# Patient Record
Sex: Female | Born: 1995 | Race: White | Hispanic: Yes | Marital: Single | Smoking: Never smoker
Health system: Southern US, Community
[De-identification: ages and names within clinical notes are randomized; demographics above are authoritative.]

## PROBLEM LIST (undated history)

## (undated) ENCOUNTER — Inpatient Hospital Stay (HOSPITAL_COMMUNITY): Payer: Self-pay

## (undated) DIAGNOSIS — J45909 Unspecified asthma, uncomplicated: Secondary | ICD-10-CM

---
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
## 2017-08-19 ENCOUNTER — Emergency Department (HOSPITAL_COMMUNITY): Payer: Self-pay

## 2017-08-19 ENCOUNTER — Emergency Department (HOSPITAL_COMMUNITY)
Admission: EM | Admit: 2017-08-19 | Discharge: 2017-08-20 | Disposition: A | Discharge status: Home / Self Care | Payer: Self-pay | Attending: Emergency Medicine | Admitting: Emergency Medicine

## 2017-08-19 ENCOUNTER — Other Ambulatory Visit: Payer: Self-pay

## 2017-08-19 DIAGNOSIS — J45909 Unspecified asthma, uncomplicated: Secondary | ICD-10-CM | POA: Insufficient documentation

## 2017-08-19 DIAGNOSIS — Z79899 Other long term (current) drug therapy: Secondary | ICD-10-CM | POA: Insufficient documentation

## 2017-08-19 DIAGNOSIS — F410 Panic disorder [episodic paroxysmal anxiety] without agoraphobia: Secondary | ICD-10-CM | POA: Insufficient documentation

## 2017-08-19 LAB — CBC
HEMATOCRIT: 42.3 % (ref 36.0–46.0)
Hemoglobin: 13.9 g/dL (ref 12.0–15.0)
MCH: 28.1 pg (ref 26.0–34.0)
MCHC: 32.9 g/dL (ref 30.0–36.0)
MCV: 85.6 fL (ref 78.0–100.0)
PLATELETS: 309 10*3/uL (ref 150–400)
RBC: 4.94 MIL/uL (ref 3.87–5.11)
RDW: 12.7 % (ref 11.5–15.5)
WBC: 9.1 10*3/uL (ref 4.0–10.5)

## 2017-08-19 LAB — BASIC METABOLIC PANEL
Anion gap: 9 (ref 5–15)
BUN: 8 mg/dL (ref 6–20)
CO2: 27 mmol/L (ref 22–32)
CREATININE: 0.83 mg/dL (ref 0.44–1.00)
Calcium: 9 mg/dL (ref 8.9–10.3)
Chloride: 105 mmol/L (ref 101–111)
GFR calc Af Amer: >60 mL/min (ref >60)
Glucose, Bld: 89 mg/dL (ref 65–99)
POTASSIUM: 3.5 mmol/L (ref 3.5–5.1)
Sodium: 141 mmol/L (ref 135–145)

## 2017-08-19 LAB — I-STAT TROPONIN, ED: TROPONIN I, POC: 0 ng/mL (ref 0.00–0.08)

## 2017-08-19 LAB — I-STAT BETA HCG BLOOD, ED (MC, WL, AP ONLY)

## 2017-08-19 NOTE — ED Triage Notes (Signed)
Patient arrived to triage crying; began having CP at work and her teeth began "chattering. Also c/o SOB

## 2017-08-20 MED ORDER — LORAZEPAM 1 MG PO TABS: 1.0000 mg | ORAL_TABLET | Freq: Three times a day (TID) | ORAL | 0 refills | Status: DC | PRN

## 2017-08-20 NOTE — ED Provider Notes (Signed)
TIME SEEN: 4:07 AM  CHIEF COMPLAINT: Chest tightness  HPI: Patient is a 22 year old female with history of asthma who presents to the emergency department with episode of chest tightness and feeling like her lip was shaking and becoming tearful at work tonight.  She was sent home from work because of this episode.  Has had this happen to her before and thinks she was having a panic attack.  No history of PE, DVT, exogenous estrogen use, recent fractures, surgery, trauma, hospitalization or prolonged travel. No lower extremity swelling or pain. No calf tenderness.  No symptoms currently.  No fever or cough.  She denies SI or HI.  She states she has been under a lot of stress recently.  ROS: See HPI Constitutional: no fever  Eyes: no drainage  ENT: no runny nose   Cardiovascular:   chest pain  Resp: no SOB  GI: no vomiting GU: no dysuria Integumentary: no rash  Allergy: no hives  Musculoskeletal: no leg swelling  Neurological: no slurred speech ROS otherwise negative  PAST MEDICAL HISTORY/PAST SURGICAL HISTORY:  Past Medical History:  Diagnosis Date  . Asthma     MEDICATIONS:  Prior to Admission medications   Medication Sig Start Date End Date Taking? Authorizing Provider  albuterol (PROVENTIL HFA;VENTOLIN HFA) 108 (90 BASE) MCG/ACT inhaler Inhale 2 puffs into the lungs every 4 (four) hours as needed for wheezing or shortness of breath. For shortness of breath Patient not taking: Reported on 08/19/2017 09/14/13   Dessa Phi, MD  beclomethasone (QVAR) 80 MCG/ACT inhaler Inhale 2 puffs into the lungs 2 (two) times daily. Patient not taking: Reported on 08/19/2017 09/14/13   Dessa Phi, MD  hydrocortisone 1 % lotion Apply 1 application topically 2 (two) times daily. Apply to face for one week Patient not taking: Reported on 08/19/2017 09/14/13   Dessa Phi, MD  loratadine (CLARITIN) 10 MG tablet Take 1 tablet (10 mg total) by mouth daily. Patient not taking: Reported on  08/19/2017 09/14/13   Dessa Phi, MD  triamcinolone ointment (KENALOG) 0.5 % Apply 1 application topically 2 (two) times daily. Apply to arms Patient not taking: Reported on 08/19/2017 09/14/13   Dessa Phi, MD    ALLERGIES:  No Known Allergies  SOCIAL HISTORY:  Social History   Tobacco Use  . Smoking status: Never Smoker  Substance Use Topics  . Alcohol use: No    FAMILY HISTORY: Family History  Problem Relation Age of Onset  . Cancer Mother     EXAM: BP 121/67 (BP Location: Right Arm)   Pulse 61   Temp 99.5 F (37.5 C) (Oral)   Resp 16   Ht  (1.575 m)   Wt 81.6 kg (180 lb)   LMP 08/09/2017   SpO2 99%   BMI 32.92 kg/m  CONSTITUTIONAL: Alert and oriented and responds appropriately to questions. Well-appearing; well-nourished, tearful when I am talking to her HEAD: Normocephalic EYES: Conjunctivae clear, pupils appear equal, EOMI ENT: normal nose; moist mucous membranes NECK: Supple, no meningismus, no nuchal rigidity, no LAD  CARD: RRR; S1 and S2 appreciated; no murmurs, no clicks, no rubs, no gallops RESP: Normal chest excursion without splinting or tachypnea; breath sounds clear and equal bilaterally; no wheezes, no rhonchi, no rales, no hypoxia or respiratory distress, speaking full sentences ABD/GI: Normal bowel sounds; non-distended; soft, non-tender, no rebound, no guarding, no peritoneal signs, no hepatosplenomegaly BACK:  The back appears normal and is non-tender to palpation, there is no CVA tenderness EXT: Normal ROM  in all joints; non-tender to palpation; no edema; normal capillary refill; no cyanosis, no calf tenderness or swelling    SKIN: Normal color for age and race; warm; no rash NEURO: Moves all extremities equally PSYCH: Patient is tearful.  She denies SI or HI.  She contracts for safety.  Grooming and personal hygiene are appropriate.  MEDICAL DECISION MAKING: Patient here with likely panic attack.  Labs obtained in triage are  unremarkable including negative troponin.  She is PERC negative.  Chest x-ray clear.  No signs of volume overload or pneumonia.  Doubt dissection.  Doubt ACS.  EKG shows no ischemic abnormality.  Will discharge with brief course of Ativan to use as needed and outpatient PCP follow-up.  She denies SI or HI and is able to contract for safety.  Discussed at length return precautions.   At this time, I do not feel there is any life-threatening condition present. I have reviewed and discussed all results (EKG, imaging, lab, urine as appropriate) and exam findings with patient/family. I have reviewed nursing notes and appropriate previous records.  I feel the patient is safe to be discharged home without further emergent workup and can continue workup as an outpatient as needed. Discussed usual and customary return precautions. Patient/family verbalize understanding and are comfortable with this plan.  Outpatient follow-up has been provided if needed. All questions have been answered.      EKG Interpretation  Date/Time:  Thursday Aug 19 2017 22:07:26 EDT Ventricular Rate:  69 PR Interval:  134 QRS Duration: 88 QT Interval:  406 QTC Calculation: 435 R Axis:   67 Text Interpretation:  Sinus rhythm with marked sinus arrhythmia Otherwise normal ECG No old tracing to compare Confirmed by Arihana Ambrocio, Baxter Hire [phone]) on 08/20/2017 4:08:02 AM         Elizebath Wever, Layla Maw, DO 08/20/17 7829

## 2017-08-20 NOTE — ED Notes (Signed)
ED Provider at bedside. 

## 2020-04-12 ENCOUNTER — Emergency Department (HOSPITAL_COMMUNITY)
Admission: EM | Admit: 2020-04-12 | Discharge: 2020-04-12 | Disposition: A | Discharge status: Home / Self Care | Payer: Medicaid Other | Attending: Emergency Medicine | Admitting: Emergency Medicine

## 2020-04-12 ENCOUNTER — Emergency Department (HOSPITAL_COMMUNITY): Payer: Medicaid Other

## 2020-04-12 ENCOUNTER — Other Ambulatory Visit: Payer: Self-pay

## 2020-04-12 DIAGNOSIS — Z79899 Other long term (current) drug therapy: Secondary | ICD-10-CM | POA: Insufficient documentation

## 2020-04-12 DIAGNOSIS — J4541 Moderate persistent asthma with (acute) exacerbation: Secondary | ICD-10-CM

## 2020-04-12 LAB — CBC WITH DIFFERENTIAL/PLATELET
Abs Immature Granulocytes: 0.05 10*3/uL (ref 0.00–0.07)
Basophils Absolute: 0.1 10*3/uL (ref 0.0–0.1)
Basophils Relative: 1 %
Eosinophils Absolute: 0.7 10*3/uL — ABNORMAL HIGH (ref 0.0–0.5)
Eosinophils Relative: 5 %
HCT: 48.8 % — ABNORMAL HIGH (ref 36.0–46.0)
Hemoglobin: 16 g/dL — ABNORMAL HIGH (ref 12.0–15.0)
Immature Granulocytes: 0 %
Lymphocytes Relative: 19 %
Lymphs Abs: 2.9 10*3/uL (ref 0.7–4.0)
MCH: 28.2 pg (ref 26.0–34.0)
MCHC: 32.8 g/dL (ref 30.0–36.0)
MCV: 86.1 fL (ref 80.0–100.0)
Monocytes Absolute: 0.8 10*3/uL (ref 0.1–1.0)
Monocytes Relative: 6 %
Neutro Abs: 10.6 10*3/uL — ABNORMAL HIGH (ref 1.7–7.7)
Neutrophils Relative %: 69 %
Platelets: 353 10*3/uL (ref 150–400)
RBC: 5.67 MIL/uL — ABNORMAL HIGH (ref 3.87–5.11)
RDW: 12.6 % (ref 11.5–15.5)
WBC: 15.2 10*3/uL — ABNORMAL HIGH (ref 4.0–10.5)
nRBC: 0 % (ref 0.0–0.2)

## 2020-04-12 LAB — COMPREHENSIVE METABOLIC PANEL
ALT: 31 U/L (ref 0–44)
AST: 26 U/L (ref 15–41)
Albumin: 4.8 g/dL (ref 3.5–5.0)
Alkaline Phosphatase: 61 U/L (ref 38–126)
Anion gap: 11 (ref 5–15)
BUN: 7 mg/dL (ref 6–20)
CO2: 26 mmol/L (ref 22–32)
Calcium: 10 mg/dL (ref 8.9–10.3)
Chloride: 103 mmol/L (ref 98–111)
Creatinine, Ser: 0.77 mg/dL (ref 0.44–1.00)
GFR, Estimated: >60 mL/min (ref >60)
Glucose, Bld: 94 mg/dL (ref 70–99)
Potassium: 4.2 mmol/L (ref 3.5–5.1)
Sodium: 140 mmol/L (ref 135–145)
Total Bilirubin: 0.8 mg/dL (ref 0.3–1.2)
Total Protein: 8.2 g/dL — ABNORMAL HIGH (ref 6.5–8.1)

## 2020-04-12 LAB — HCG, QUANTITATIVE, PREGNANCY: hCG, Beta Chain, Quant, S: 3 m[IU]/mL (ref <5)

## 2020-04-12 MED ORDER — PREDNISONE 20 MG PO TABS: 40.0000 mg | ORAL_TABLET | Freq: Every day | ORAL | 0 refills | Status: AC

## 2020-04-12 MED ORDER — ALBUTEROL SULFATE HFA 108 (90 BASE) MCG/ACT IN AERS: 2.0000 | INHALATION_SPRAY | RESPIRATORY_TRACT | 0 refills | Status: DC | PRN

## 2020-04-12 MED ORDER — DEXAMETHASONE SODIUM PHOSPHATE 10 MG/ML IJ SOLN
10.0000 mg | Freq: Once | INTRAMUSCULAR | Status: AC
  Administered 2020-04-12: 10 mg via INTRAVENOUS
  Filled 2020-04-12: qty 1

## 2020-04-12 MED ORDER — ALBUTEROL SULFATE HFA 108 (90 BASE) MCG/ACT IN AERS
5.0000 | INHALATION_SPRAY | Freq: Once | RESPIRATORY_TRACT | Status: AC
  Administered 2020-04-12: 5 via RESPIRATORY_TRACT
  Filled 2020-04-12: qty 6.7

## 2020-04-12 MED ORDER — MAGNESIUM SULFATE 2 GM/50ML IV SOLN
2.0000 g | Freq: Once | INTRAVENOUS | Status: AC
  Administered 2020-04-12: 2 g via INTRAVENOUS
  Filled 2020-04-12: qty 50

## 2020-04-12 NOTE — Discharge Instructions (Signed)
I have provided a prescription for an albuterol inhaler, please use this as needed.  You also go home on a short course of steroids, this should help with your breathing.  You will need to schedule an appointment with your primary care physician in order to follow-up for your moderate cyst and asthma.  If you experience any shortness of breath, chest pain, worsening symptoms please return to the emergency department.

## 2020-04-12 NOTE — ED Triage Notes (Signed)
Pt arrives to ED with c/o asthma exacerbation.  Pt states since last night after she was around dog hair she become SOB and had wheezing. Pt does not have her inhaler for relief.

## 2020-04-12 NOTE — ED Provider Notes (Signed)
[facility] EMERGENCY DEPARTMENT Provider Note   CSN: [id number] Arrival date & time: 04/12/20  1018     History Chief Complaint  Patient presents with  . Asthma    Monica Burgess is a 24 y.o. female.  24 y.o female a PMH of Asthma presents to the ED with a chief complaint of asthma exacerbation x yesterday.  Patient reports wheezing began after being around dog hair, reports that has worsened throughout the day and got more severe today.  She has not tried any irrigation for improvement in her symptoms.  Symptoms are exacerbated with ambulation.  Does have a prior history of status Medicus.  Reports she has been out of her inhaler for the past 2 days.  Denies any sick contacts, fevers, chest pain or other complaints.  The history is provided by the patient.  Asthma This is a recurrent problem. The current episode started 12 to 24 hours ago. The problem occurs constantly. The problem has been gradually worsening. Associated symptoms include shortness of breath. Pertinent negatives include no chest pain, no abdominal pain and no headaches. The symptoms are aggravated by exertion. Nothing relieves the symptoms. She has tried nothing for the symptoms.       Past Medical History:  Diagnosis Date  . Asthma     Patient Active Problem List   Diagnosis Date Noted  . Pityriasis alba 09/14/2013  . Moderate persistent asthma 04/08/2012  . RHINITIS, ALLERGIC NEC 01/04/2007  . Irritant dermatitis 01/04/2007    No past surgical history on file.   OB History   No obstetric history on file.     Family History  Problem Relation Age of Onset  . Cancer Mother     Social History   Tobacco Use  . Smoking status: Never Smoker  Substance Use Topics  . Alcohol use: No  . Drug use: No    Home Medications Prior to Admission medications   Medication Sig Start Date End Date Taking? Authorizing Provider  acetaminophen (TYLENOL) 500 MG tablet Take 1,000 mg by mouth  every 6 (six) hours as needed for mild pain or headache.   Yes [provider]  loratadine (CLARITIN) 10 MG tablet Take 1 tablet (10 mg total) by mouth daily. Patient taking differently: Take 10 mg by mouth daily as needed for allergies. 09/14/13  Yes Funches, Josalyn, MD  triamcinolone ointment (KENALOG) 0.5 % Apply 1 application topically 2 (two) times daily. Apply to arms Patient taking differently: Apply 1 application topically 2 (two) times daily as needed (eczema). Apply to arms 09/14/13  Yes Funches, Josalyn, MD  albuterol (VENTOLIN HFA) 108 (90 Base) MCG/ACT inhaler Inhale 2 puffs into the lungs every 4 (four) hours as needed for wheezing or shortness of breath. 04/12/20 05/12/20  Claude Manges, PA-C  LORazepam (ATIVAN) 1 MG tablet Take 1 tablet (1 mg total) by mouth 3 (three) times daily as needed for anxiety. Patient not taking: Reported on 04/12/2020 08/20/17   Ward, Layla Maw, DO  predniSONE (DELTASONE) 20 MG tablet Take 2 tablets (40 mg total) by mouth daily for 5 days. 04/12/20 04/17/20  Claude Manges, PA-C    Allergies    Patient has no known allergies.  Review of Systems   Review of Systems  Constitutional: Negative for chills and fever.  Respiratory: Positive for shortness of breath.   Cardiovascular: Negative for chest pain.  Gastrointestinal: Negative for abdominal pain.  Neurological: Negative for headaches.  All other systems reviewed and are negative.  Physical Exam Updated Vital Signs BP (!) 141/67   Pulse (!) 107   Temp 98 F (36.7 C) (Oral)   Resp (!) 24   Ht 5\' 2"  (1.575 m)   Wt 81.6 kg   LMP 03/14/2020   SpO2 96%   BMI 32.92 kg/m   Physical Exam Vitals and nursing note reviewed.  Constitutional:      General: She is not in acute distress.    Appearance: She is well-developed and well-nourished.  HENT:     Head: Normocephalic and atraumatic.     Mouth/Throat:     Mouth: Oropharynx is clear and moist.     Pharynx: No oropharyngeal exudate.   Eyes:     Pupils: Pupils are equal, round, and reactive to light.  Cardiovascular:     Rate and Rhythm: Regular rhythm.     Heart sounds: Normal heart sounds.  Pulmonary:     Effort: Pulmonary effort is normal. No tachypnea or respiratory distress.     Breath sounds: Examination of the right-upper field reveals wheezing. Examination of the right-middle field reveals wheezing. Examination of the right-lower field reveals wheezing. Examination of the left-lower field reveals wheezing. Wheezing present. No decreased breath sounds.     Comments: Expiratory wheezing throughout all lung fields. Abdominal:     General: Bowel sounds are normal. There is no distension.     Palpations: Abdomen is soft.     Tenderness: There is no abdominal tenderness.  Musculoskeletal:        General: No tenderness or deformity.     Cervical back: Normal range of motion.     Right lower leg: No edema.     Left lower leg: No edema.  Skin:    General: Skin is warm and dry.  Neurological:     Mental Status: She is alert and oriented to person, place, and time.  Psychiatric:        Mood and Affect: Mood and affect normal.     ED Results / Procedures / Treatments   Labs (all labs ordered are listed, but only abnormal results are displayed) Labs Reviewed  CBC WITH DIFFERENTIAL/PLATELET - Abnormal; Notable for the following components:      Result Value   WBC 15.2 (*)    RBC 5.67 (*)    Hemoglobin 16.0 (*)    HCT 48.8 (*)    Neutro Abs 10.6 (*)    Eosinophils Absolute 0.7 (*)    All other components within normal limits  COMPREHENSIVE METABOLIC PANEL - Abnormal; Notable for the following components:   Total Protein 8.2 (*)    All other components within normal limits  HCG, QUANTITATIVE, PREGNANCY    EKG None  Radiology DG Chest 2 View  Result Date: 04/12/2020 CLINICAL DATA:  Chest pain shortness of breath EXAM: CHEST - 2 VIEW COMPARISON:  08/19/2017 FINDINGS: The heart size and mediastinal  contours are within normal limits. Both lungs are clear. The visualized skeletal structures are unremarkable. IMPRESSION: No active cardiopulmonary disease. Electronically Signed   By: 10/19/2017 M.D.   On: 04/12/2020 11:37    Procedures Procedures (including critical care time)  Medications Ordered in ED Medications  albuterol (VENTOLIN HFA) 108 (90 Base) MCG/ACT inhaler 5 puff (5 puffs Inhalation Given 04/12/20 1138)  dexamethasone (DECADRON) injection 10 mg (10 mg Intravenous Given 04/12/20 1141)  magnesium sulfate IVPB 2 g 50 mL (2 g Intravenous New Bag/Given 04/12/20 1146)    ED Course  I have reviewed the triage  vital signs and the nursing notes.  Pertinent labs & imaging results that were available during my care of the patient were reviewed by me and considered in my medical decision making (see chart for details).    MDM Rules/Calculators/A&P   Patient with a past medical history of asthma currently out of her inhaler presents to the ED status post asthma exacerbation after coming in contact with dog hair.  Recently ran out of her inhaler approximately 2 days ago.  Reports symptoms have worsened throughout the day.  Has not tried any medication for improvement in her symptoms.  Does have a prior history of status asthmaticus at the age of 54.     Extensive chart review and records from 2015 patient does suffer from moderate persistent asthma.  Patient was evaluated back then, reports that she was having frequent night awakenings due to shortness of breath.  She reports her symptoms have kind of remained the same since his visit.  Currently does not have any PCP care, therefore has been out of inhaler.  During evaluation patient has significant expiratory wheezing on my exam.  Oxygen saturation in triage was 97% on room air.  Tachypnea is absent at this time and no accessory muscle has been used.  No tachycardia.  She denies any sick contacts or fevers.  X-ray without any signs  of infiltrates, no acute findings.  CBC with a significant leukocytosis, she was given 10 mg of IV Decadron while in the ED.  CMP without any electrolyte derangement, creatinine function is within normal limits.  LFTs are unremarkable.  There is no anion gap at this time.  And is receiving albuterol inhaler to help with symptomatic treatment due to the pandemic at the time unable to provide her with nebulizer.  She is also receiving Mg+ to help with her breathing.  Results of imaging were discussed with patient along with labs.  She does report improvement in her symptoms.  Reevaluation of lungs sound better than prior to intervention.  She is able to speak in full sentences, no respiratory distress, she is able to move around without oxygen saturation dropping.  We discussed appropriate follow-up with PCP as she has not been seen by 1 in the past 3 years.  She also go home on a short course of steroids to help with her exacerbation.  Patient understands and agrees with management, discharged from the ED in stable condition.   Portions of this note were generated with Scientist, clinical (histocompatibility and immunogenetics). Dictation errors may occur despite best attempts at proofreading.  Final Clinical Impression(s) / ED Diagnoses Final diagnoses:  Moderate persistent asthma with exacerbation    Rx / DC Orders ED Discharge Orders         Ordered    predniSONE (DELTASONE) 20 MG tablet  Daily        04/12/20 1355    albuterol (VENTOLIN HFA) 108 (90 Base) MCG/ACT inhaler  Every 4 hours PRN        04/12/20 1355           Claude Manges, PA-C 04/12/20 1357    Linwood Dibbles, MD 04/13/20 [phone]

## 2020-04-20 NOTE — L&D Delivery Note (Signed)
OB/GYN Faculty Practice Delivery Note  Monica Burgess is a 25 y.o. G1P1001 s/p SVD at [redacted]w[redacted]d. She was admitted for IOL due to severe pre-eclampsia.   ROM: 16h 61m with clear fluid GBS Status: Positive; PCN given   Delivery Date/Time: 04/12/21 at 1103  Delivery: Called to room and patient was complete and pushing. Head delivered middle occiput anterior. No nuchal cord present. Shoulder and body delivered in usual fashion. Infant with spontaneous cry, placed on mother's abdomen, dried and stimulated. Cord clamped x 2 after 1-minute delay and cut by FOB under my direct supervision. Cord blood drawn. Placenta delivered spontaneously with gentle cord traction. Fundus firm with massage and Pitocin. Labia, perineum, vagina, and cervix were inspected, and no lacerations were noted.   Placenta: Intact, 3VC - sent to L&D Complications: None Lacerations: None EBL: 100 cc Analgesia: Epidural   Infant: Viable female   APGARs 83 and 34   Evalina Field, MD OB/GYN Fellow, Faculty Practice

## 2020-08-29 ENCOUNTER — Other Ambulatory Visit: Payer: Self-pay

## 2020-08-29 ENCOUNTER — Inpatient Hospital Stay (HOSPITAL_COMMUNITY): Payer: Medicaid Other

## 2020-08-29 ENCOUNTER — Encounter (HOSPITAL_COMMUNITY): Payer: Self-pay | Admitting: Obstetrics and Gynecology

## 2020-08-29 ENCOUNTER — Inpatient Hospital Stay (HOSPITAL_COMMUNITY)
Admission: AD | Admit: 2020-08-29 | Discharge: 2020-08-29 | Disposition: A | Discharge status: Home / Self Care | Payer: Medicaid Other | Attending: Obstetrics and Gynecology | Admitting: Obstetrics and Gynecology

## 2020-08-29 DIAGNOSIS — R109 Unspecified abdominal pain: Secondary | ICD-10-CM | POA: Insufficient documentation

## 2020-08-29 DIAGNOSIS — O3680X0 Pregnancy with inconclusive fetal viability, not applicable or unspecified: Secondary | ICD-10-CM

## 2020-08-29 DIAGNOSIS — Z3A08 8 weeks gestation of pregnancy: Secondary | ICD-10-CM | POA: Diagnosis not present

## 2020-08-29 DIAGNOSIS — R102 Pelvic and perineal pain: Secondary | ICD-10-CM

## 2020-08-29 DIAGNOSIS — O26899 Other specified pregnancy related conditions, unspecified trimester: Secondary | ICD-10-CM

## 2020-08-29 DIAGNOSIS — O26891 Other specified pregnancy related conditions, first trimester: Secondary | ICD-10-CM | POA: Diagnosis not present

## 2020-08-29 DIAGNOSIS — Z6790 Unspecified blood type, Rh positive: Secondary | ICD-10-CM

## 2020-08-29 LAB — COMPREHENSIVE METABOLIC PANEL
ALT: 23 U/L (ref 0–44)
AST: 18 U/L (ref 15–41)
Albumin: 3.8 g/dL (ref 3.5–5.0)
Alkaline Phosphatase: 39 U/L (ref 38–126)
Anion gap: 7 (ref 5–15)
BUN: 8 mg/dL (ref 6–20)
CO2: 23 mmol/L (ref 22–32)
Calcium: 8.8 mg/dL — ABNORMAL LOW (ref 8.9–10.3)
Chloride: 105 mmol/L (ref 98–111)
Creatinine, Ser: 0.76 mg/dL (ref 0.44–1.00)
GFR, Estimated: >60 mL/min (ref >60)
Glucose, Bld: 99 mg/dL (ref 70–99)
Potassium: 3.8 mmol/L (ref 3.5–5.1)
Sodium: 135 mmol/L (ref 135–145)
Total Bilirubin: 0.5 mg/dL (ref 0.3–1.2)
Total Protein: 6.5 g/dL (ref 6.5–8.1)

## 2020-08-29 LAB — URINALYSIS, ROUTINE W REFLEX MICROSCOPIC
Bilirubin Urine: NEGATIVE
Glucose, UA: NEGATIVE mg/dL
Hgb urine dipstick: NEGATIVE
Ketones, ur: NEGATIVE mg/dL
Leukocytes,Ua: NEGATIVE
Nitrite: NEGATIVE
Protein, ur: NEGATIVE mg/dL
Specific Gravity, Urine: 1.02 (ref 1.005–1.030)
pH: 9 — ABNORMAL HIGH (ref 5.0–8.0)

## 2020-08-29 LAB — CBC
HCT: 42.3 % (ref 36.0–46.0)
Hemoglobin: 13.7 g/dL (ref 12.0–15.0)
MCH: 28.5 pg (ref 26.0–34.0)
MCHC: 32.4 g/dL (ref 30.0–36.0)
MCV: 87.9 fL (ref 80.0–100.0)
Platelets: 315 10*3/uL (ref 150–400)
RBC: 4.81 MIL/uL (ref 3.87–5.11)
RDW: 13.2 % (ref 11.5–15.5)
WBC: 9 10*3/uL (ref 4.0–10.5)
nRBC: 0 % (ref 0.0–0.2)

## 2020-08-29 LAB — WET PREP, GENITAL
Clue Cells Wet Prep HPF POC: NONE SEEN
Sperm: NONE SEEN
Trich, Wet Prep: NONE SEEN
Yeast Wet Prep HPF POC: NONE SEEN

## 2020-08-29 LAB — ABO/RH: ABO/RH(D): AB POS

## 2020-08-29 LAB — POCT PREGNANCY, URINE: Preg Test, Ur: POSITIVE — AB

## 2020-08-29 LAB — OB RESULTS CONSOLE GC/CHLAMYDIA: Gonorrhea: NEGATIVE

## 2020-08-29 LAB — HCG, QUANTITATIVE, PREGNANCY: hCG, Beta Chain, Quant, S: 4021 m[IU]/mL — ABNORMAL HIGH (ref <5)

## 2020-08-29 NOTE — MAU Provider Note (Signed)
History     CSN: [id number]  Arrival date and time: 08/29/20 1226   Chief Complaint  Patient presents with  . Abdominal Pain   25 y.o. G1 @[redacted]w[redacted]d  by unsure LMP presenting with LAP and spotting. Reports onset of pain 5 days ago. Describes as cramping, bilateral in lower abdomen. Rates pain 3/10. Has not taken anything for it. Denies urinary sx. Reports occasional vaginal spotting. No fevers. Having constipation.   OB History    Gravida  1   Para      Term      Preterm      AB      Living        SAB      IAB      Ectopic      Multiple      Live Births              Past Medical History:  Diagnosis Date  . Asthma     No past surgical history on file.  Family History  Problem Relation Age of Onset  . Cancer Mother     Social History   Tobacco Use  . Smoking status: Never Smoker  Substance Use Topics  . Alcohol use: No  . Drug use: No    Allergies: No Known Allergies  No medications prior to admission.    Review of Systems  Constitutional: Negative for chills and fever.  Gastrointestinal: Positive for abdominal pain and constipation.  Genitourinary: Positive for vaginal bleeding. Negative for dysuria, frequency and urgency.   Physical Exam   Blood pressure 135/80, pulse 85, temperature 98.1 F (36.7 C), temperature source Oral, resp. rate 16, last menstrual period 07/02/2020, SpO2 97 %.  Physical Exam Vitals and nursing note reviewed.  Constitutional:      General: She is not in acute distress.    Appearance: Normal appearance.  HENT:     Head: Normocephalic and atraumatic.  Pulmonary:     Effort: Pulmonary effort is normal. No respiratory distress.  Abdominal:     General: There is no distension.     Palpations: Abdomen is soft. There is no mass.     Tenderness: There is no abdominal tenderness. There is no guarding or rebound.     Hernia: No hernia is present.  Musculoskeletal:        General: Normal range of motion.      Cervical back: Normal range of motion.  Skin:    General: Skin is warm and dry.  Neurological:     General: No focal deficit present.     Mental Status: She is alert and oriented to person, place, and time.  Psychiatric:        Mood and Affect: Mood normal.        Behavior: Behavior normal.    Results for orders placed or performed during the hospital encounter of 08/29/20 (from the past 24 hour(s))  Pregnancy, urine POC     Status: Abnormal   Collection Time: 08/29/20 12:48 PM  Result Value Ref Range   Preg Test, Ur POSITIVE (A) NEGATIVE  Urinalysis, Routine w reflex microscopic Urine, Clean Catch     Status: Abnormal   Collection Time: 08/29/20 12:50 PM  Result Value Ref Range   Color, Urine YELLOW YELLOW   APPearance HAZY (A) CLEAR   Specific Gravity, Urine 1.020 1.005 - 1.030   pH 9.0 (H) 5.0 - 8.0   Glucose, UA NEGATIVE NEGATIVE mg/dL   Hgb urine dipstick NEGATIVE  NEGATIVE   Bilirubin Urine NEGATIVE NEGATIVE   Ketones, ur NEGATIVE NEGATIVE mg/dL   Protein, ur NEGATIVE NEGATIVE mg/dL   Nitrite NEGATIVE NEGATIVE   Leukocytes,Ua NEGATIVE NEGATIVE  Wet prep, genital     Status: Abnormal   Collection Time: 08/29/20 12:58 PM   Specimen: PATH Cytology Cervicovaginal Ancillary Only  Result Value Ref Range   Yeast Wet Prep HPF POC NONE SEEN NONE SEEN   Trich, Wet Prep NONE SEEN NONE SEEN   Clue Cells Wet Prep HPF POC NONE SEEN NONE SEEN   WBC, Wet Prep HPF POC MODERATE (A) NONE SEEN   Sperm NONE SEEN   CBC     Status: None   Collection Time: 08/29/20  1:18 PM  Result Value Ref Range   WBC 9.0 4.0 - 10.5 K/uL   RBC 4.81 3.87 - 5.11 MIL/uL   Hemoglobin 13.7 12.0 - 15.0 g/dL   HCT 35.3 61.4 - 43.1 %   MCV 87.9 80.0 - 100.0 fL   MCH 28.5 26.0 - 34.0 pg   MCHC 32.4 30.0 - 36.0 g/dL   RDW 54.0 08.6 - 76.1 %   Platelets 315 150 - 400 K/uL   nRBC 0.0 0.0 - 0.2 %  Comprehensive metabolic panel     Status: Abnormal   Collection Time: 08/29/20  1:18 PM  Result Value Ref Range    Sodium 135 135 - 145 mmol/L   Potassium 3.8 3.5 - 5.1 mmol/L   Chloride 105 98 - 111 mmol/L   CO2 23 22 - 32 mmol/L   Glucose, Bld 99 70 - 99 mg/dL   BUN 8 6 - 20 mg/dL   Creatinine, Ser 9.50 0.44 - 1.00 mg/dL   Calcium 8.8 (L) 8.9 - 10.3 mg/dL   Total Protein 6.5 6.5 - 8.1 g/dL   Albumin 3.8 3.5 - 5.0 g/dL   AST 18 15 - 41 U/L   ALT 23 0 - 44 U/L   Alkaline Phosphatase 39 38 - 126 U/L   Total Bilirubin 0.5 0.3 - 1.2 mg/dL   GFR, Estimated >93 >26 mL/min   Anion gap 7 5 - 15  hCG, quantitative, pregnancy     Status: Abnormal   Collection Time: 08/29/20  1:18 PM  Result Value Ref Range   hCG, Beta Chain, Quant, S 4,021 (H) <5 mIU/mL  ABO/Rh     Status: None   Collection Time: 08/29/20  1:18 PM  Result Value Ref Range   ABO/RH(D) AB POS    No rh immune globuloin      NOT A RH IMMUNE GLOBULIN CANDIDATE, PT RH POSITIVE Performed at [facility], [address], [place], [place] [address]    US OB LESS THAN 14 WEEKS WITH [place] TRANSVAGINAL  Result Date: 08/29/2020 CLINICAL DATA:  Abdominal pain. Eight weeks and 2 days pregnant by last menstrual period. EXAM: OBSTETRIC <14 WK [place] AND TRANSVAGINAL OB US TECHNIQUE: Both transabdominal and transvaginal ultrasound examinations were performed for complete evaluation of the gestation as well as the maternal uterus, adnexal regions, and pelvic cul-de-sac. Transvaginal technique was performed to assess early pregnancy. COMPARISON:  None. FINDINGS: Intrauterine gestational sac: Visualized Yolk sac:  Visualized Embryo:  Visualized Cardiac Activity: Not visualized Heart Rate:   bpm MSD: 6.1 mm   5 w   2 d CRL:  1.9 mm. This is too small to estimate gestational age. Subchorionic hemorrhage:  None visualized. Maternal uterus/adnexae: Normal appearing maternal ovaries. No free peritoneal fluid. IMPRESSION: 1.  Single intrauterine gestation with an estimated gestational age of [redacted] weeks and 2 days. 2. No cardiac activity visible at this time. This may be  normal due to the very early in gestation. Recommend follow-up quantitative B-HCG levels and follow-up US in 14 days to assess viability. This recommendation follows SRU consensus guidelines: Diagnostic Criteria for Nonviable Pregnancy Early in the First Trimester. Malva Limes Med 2013; 094:7096-28. Electronically Signed   By: Beckie Salts M.D.   On: 08/29/2020 14:37   MAU Course  Procedures  MDM Labs and [place] ordered and reviewed. IUP identified but unclear viability at this time and s<d. Will order f/u US in 2 weeks. Discussed results and plan with pt. Stable for discharge home.  Assessment and Plan   1. Pregnancy with inconclusive fetal viability, single or unspecified fetus   2. Pelvic cramping in antepartum period   3. Blood type, Rh positive    Discharge home Follow up at [facility] for [place] in 2 weeks-ordered SAB precautions Miralax prn Tylenol prn Heating pad prn  Allergies as of 08/29/2020   No Known Allergies     Medication List    STOP taking these medications   LORazepam 1 MG tablet Commonly known as: Ativan     TAKE these medications   acetaminophen 500 MG tablet Commonly known as: TYLENOL Take 1,000 mg by mouth every 6 (six) hours as needed for mild pain or headache.   albuterol 108 (90 Base) MCG/ACT inhaler Commonly known as: VENTOLIN HFA Inhale 2 puffs into the lungs every 4 (four) hours as needed for wheezing or shortness of breath.   loratadine 10 MG tablet Commonly known as: CLARITIN Take 1 tablet (10 mg total) by mouth daily. What changed:   when to take this  reasons to take this   triamcinolone ointment 0.5 % Commonly known as: KENALOG Apply 1 application topically 2 (two) times daily. Apply to arms What changed:   when to take this  reasons to take this       Donette Larry, CNM 08/29/2020, 3:03 PM

## 2020-08-29 NOTE — MAU Note (Signed)
Monica Burgess is a 25 y.o. at [redacted]w[redacted]d here in MAU reporting: had some positive home UPT. Since Saturday has been having intermittent abdominal cramping. Having intermittent spotting, none today. No abnormal discharge.  LMP: 07/02/20  Onset of complaint: ongoing  Pain score: 0/10  Vitals:   08/29/20 1251  BP: 135/80  Pulse: 85  Resp: 16  Temp: 98.1 F (36.7 C)  SpO2: 97%     Lab orders placed from triage: UPT, UA

## 2020-08-29 NOTE — Discharge Instructions (Signed)
Abdominal Pain During Pregnancy Belly (abdominal) pain is common during pregnancy. There are many possible causes. Some causes are more serious than others. Sometimes the cause is not known. Always tell your doctor if you have belly pain. Follow these instructions at home:  Do not have sex or put anything in your vagina until your pain goes away completely.  Get plenty of rest until your pain gets better.  Drink enough fluid to keep your pee (urine) pale yellow.  Take over-the-counter and prescription medicines only as told by your doctor.  Keep all follow-up visits.   Contact a doctor if:  You keep having pain after resting.  Your pain gets worse after resting.  You have lower belly pain that: ? Comes and goes at regular times. ? Spreads to your back. ? Feels like menstrual cramps.  You have pain or burning when you pee (urinate). Get help right away if:  You have a fever or chills.  You feel like it is hard to breathe.  You have bleeding from your vagina.  You are leaking fluid or tissue from your vagina.  You vomit for more than 24 hours.  You have watery poop (diarrhea) for more than 24 hours.  Your baby is moving less than usual.  You feel very weak or faint.  You have very bad pain in your upper belly. Summary  Belly pain is common during pregnancy. There are many possible causes.  If you have belly pain during pregnancy, tell your doctor right away.  Keep all follow-up visits. This information is not intended to replace advice given to you by your health care provider. Make sure you discuss any questions you have with your health care provider. Document Revised: 12/19/2019 Document Reviewed: 12/19/2019 Elsevier Patient Education  2021 [employer]vier Inc.  

## 2020-08-30 LAB — GC/CHLAMYDIA PROBE AMP (~~LOC~~) NOT AT ARMC
Chlamydia: NEGATIVE
Comment: NEGATIVE
Comment: NORMAL
Neisseria Gonorrhea: NEGATIVE

## 2020-10-27 ENCOUNTER — Emergency Department (HOSPITAL_COMMUNITY)
Admission: EM | Admit: 2020-10-27 | Discharge: 2020-10-27 | Disposition: A | Discharge status: Home / Self Care | Payer: Medicaid Other | Attending: Emergency Medicine | Admitting: Emergency Medicine

## 2020-10-27 ENCOUNTER — Encounter (HOSPITAL_COMMUNITY): Payer: Self-pay

## 2020-10-27 DIAGNOSIS — J4540 Moderate persistent asthma, uncomplicated: Secondary | ICD-10-CM | POA: Diagnosis not present

## 2020-10-27 DIAGNOSIS — L0100 Impetigo, unspecified: Secondary | ICD-10-CM | POA: Insufficient documentation

## 2020-10-27 DIAGNOSIS — L089 Local infection of the skin and subcutaneous tissue, unspecified: Secondary | ICD-10-CM | POA: Diagnosis present

## 2020-10-27 MED ORDER — MUPIROCIN 2 % EX OINT: 1.0000 "application " | TOPICAL_OINTMENT | Freq: Two times a day (BID) | CUTANEOUS | 1 refills | Status: DC

## 2020-10-27 MED ORDER — DOXYCYCLINE HYCLATE 100 MG PO CAPS: 100.0000 mg | ORAL_CAPSULE | Freq: Two times a day (BID) | ORAL | 0 refills | Status: AC

## 2020-10-27 NOTE — ED Triage Notes (Signed)
Pt has h/o eczema and states she has it on her face around her mouth. Pt reports now it is oozing. Pt reports she tried petroleum  jelly and it made it worse.

## 2020-10-27 NOTE — ED Provider Notes (Signed)
[facility] EMERGENCY DEPARTMENT Provider Note   CSN: [id number] Arrival date & time: 10/27/20  6734     History Chief Complaint  Patient presents with   Rash    Gerald Honea is a 25 y.o. female.  25 year old female with history of eczema presents with concern for infection to her face.  Patient states that she developed a small eczema patch about 3 months ago to her right chin area, she has been applying Vaseline to this area however it now has tenderness with redness along her chin area with drainage.      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 No pertinent surgical history.   OB History     Gravida  1   Para      Term      Preterm      AB      Living         SAB      IAB      Ectopic      Multiple      Live Births              Family History  Problem Relation Age of Onset   Cancer Mother     Social History   Tobacco Use   Smoking status: Never  Substance Use Topics   Alcohol use: No   Drug use: No    Home Medications Prior to Admission medications   Medication Sig Start Date End Date Taking? Authorizing Provider  doxycycline (VIBRAMYCIN) 100 MG capsule Take 1 capsule (100 mg total) by mouth 2 (two) times daily for 7 days. 10/27/20 11/03/20 Yes Jeannie Fend, PA-C  mupirocin ointment (BACTROBAN) 2 % Apply 1 application topically 2 (two) times daily. 10/27/20  Yes Jeannie Fend, PA-C  acetaminophen (TYLENOL) 500 MG tablet Take 1,000 mg by mouth every 6 (six) hours as needed for mild pain or headache.    [provider]  albuterol (VENTOLIN HFA) 108 (90 Base) MCG/ACT inhaler Inhale 2 puffs into the lungs every 4 (four) hours as needed for wheezing or shortness of breath. 04/12/20  05/12/20  Claude Manges, PA-C  loratadine (CLARITIN) 10 MG tablet Take 1 tablet (10 mg total) by mouth daily. Patient taking differently: Take 10 mg by mouth daily as needed for allergies. 09/14/13   Funches, Gerilyn Nestle, MD  triamcinolone ointment (KENALOG) 0.5 % Apply 1 application topically 2 (two) times daily. Apply to arms Patient taking differently: Apply 1 application topically 2 (two) times daily as needed (eczema). Apply to arms 09/14/13   Dessa Phi, MD    Allergies    Patient has no known allergies.  Review of Systems   Review of Systems  Constitutional:  Negative for fever.  HENT:  Negative for sore throat, trouble swallowing and voice change.   Musculoskeletal:  Negative for neck pain and neck stiffness.  Skin:  Positive for color change and wound.  Allergic/Immunologic: Negative for immunocompromised state.  Hematological:  Negative for adenopathy.   Physical Exam Updated Vital Signs BP 140/81 (BP Location: Right Arm)   Pulse 60   Temp 98.3 F (36.8 C) (Oral)   Resp 18   LMP 07/02/2020   SpO2 97%   Physical Exam Vitals  and nursing note reviewed.  Constitutional:      General: She is not in acute distress.    Appearance: She is well-developed. She is not diaphoretic.  HENT:     Head: Normocephalic and atraumatic.     Mouth/Throat:     Mouth: Mucous membranes are moist.  Eyes:     Conjunctiva/sclera: Conjunctivae normal.  Pulmonary:     Effort: Pulmonary effort is normal.  Musculoskeletal:     Cervical back: Neck supple.  Lymphadenopathy:     Cervical: No cervical adenopathy.  Skin:    General: Skin is warm and dry.     Findings: Erythema present.     Comments: Mild erythema to chin extending bilaterally with honey colored crusting along the right side.  There is a tiny pustule just under the mandible midline.  Floor the mouth is soft and nontender, tongue is mobile.  Neurological:     Mental Status: She is alert and oriented to person, place, and time.   Psychiatric:        Behavior: Behavior normal.    ED Results / Procedures / Treatments   Labs (all labs ordered are listed, but only abnormal results are displayed) Labs Reviewed - No data to display  EKG None  Radiology No results found.  Procedures Procedures   Medications Ordered in ED Medications - No data to display  ED Course  I have reviewed the triage vital signs and the nursing notes.  Pertinent labs & imaging results that were available during my care of the patient were reviewed by me and considered in my medical decision making (see chart for details).  Clinical Course as of 10/27/20 0750  Wynelle Link Oct 27, 2020  4210 25 year old female with cellulitis versus impetigo to chin area likely secondary to eczema.  Plan is to treat with oral doxycycline as well as topical Bactroban.  Discussed proper use of medications.  Recommend return to ED for new or worsening symptoms otherwise recheck with PCP in 2 days.  Patient verbalizes understanding of discharge instructions and plan. [LM]    Clinical Course User Index [LM] Alden Hipp   MDM Rules/Calculators/A&P                           Final Clinical Impression(s) / ED Diagnoses Final diagnoses:  Impetigo    Rx / DC Orders ED Discharge Orders          Ordered    mupirocin ointment (BACTROBAN) 2 %  2 times daily        10/27/20 0742    doxycycline (VIBRAMYCIN) 100 MG capsule  2 times daily        10/27/20 0742             Jeannie Fend, PA-C 10/27/20 0750    Horton, Clabe Seal, DO 10/31/20 1343

## 2020-10-27 NOTE — Discharge Instructions (Signed)
Use a clean washcloth to clean your skin and then apply antibiotic ointment.  Be sure to wash each washcloth after use to prevent reinfection. Take oral antibiotics as prescribed and complete the full course. Return to emergency room for worsening or concerning symptoms otherwise recheck with your doctor in 2 days.

## 2020-12-02 LAB — OB RESULTS CONSOLE HIV ANTIBODY (ROUTINE TESTING): HIV: NONREACTIVE

## 2020-12-02 LAB — OB RESULTS CONSOLE HEPATITIS B SURFACE ANTIGEN: Hepatitis B Surface Ag: NEGATIVE

## 2020-12-02 LAB — OB RESULTS CONSOLE RPR: RPR: NONREACTIVE

## 2020-12-02 LAB — HEPATITIS C ANTIBODY: HCV Ab: NEGATIVE

## 2020-12-02 LAB — OB RESULTS CONSOLE RUBELLA ANTIBODY, IGM: Rubella: NON-IMMUNE/NOT IMMUNE

## 2021-03-20 ENCOUNTER — Other Ambulatory Visit: Payer: Self-pay

## 2021-03-20 ENCOUNTER — Inpatient Hospital Stay (HOSPITAL_COMMUNITY)
Admission: AD | Admit: 2021-03-20 | Discharge: 2021-03-20 | Disposition: A | Discharge status: Home / Self Care | Payer: Medicaid Other | Attending: Obstetrics and Gynecology | Admitting: Obstetrics and Gynecology

## 2021-03-20 ENCOUNTER — Encounter (HOSPITAL_COMMUNITY): Payer: Self-pay | Admitting: Obstetrics and Gynecology

## 2021-03-20 DIAGNOSIS — O479 False labor, unspecified: Secondary | ICD-10-CM | POA: Diagnosis not present

## 2021-03-20 DIAGNOSIS — O471 False labor at or after 37 completed weeks of gestation: Secondary | ICD-10-CM | POA: Diagnosis present

## 2021-03-20 DIAGNOSIS — Z3A37 37 weeks gestation of pregnancy: Secondary | ICD-10-CM | POA: Insufficient documentation

## 2021-03-20 NOTE — MAU Note (Signed)
Pt reports abdominal cramps since Tuesday. Pt reports she was fine Wednesday, but the abdominal cramps started again last night. Pt reports abdominal cramps are more intense.  Denies vaginal bleeding or LOF.   Reports +FM

## 2021-03-20 NOTE — MAU Provider Note (Signed)
S: Patient is here for RN labor evaluation. Fetal tracing, vital signs, & chart reviewed   O:  Vitals:   03/20/21 1448  Resp: 20  Temp: 98.6 F (37 C)  SpO2: 98%   No results found for this or any previous visit (from the past 24 hour(s)).  Dilation: Closed Exam by:: Zenia Resides, RN   FHR: 130 bpm, Mod Var, no Decels, + Accels UC: contractions irregular    A: 1. False labor    Cervix closed on check, contracting irregularly  P:  RN to discharge home in stable condition with return precautions & fetal kick counts  Warner Mccreedy, MD @T @ 3:23 PM

## 2021-04-08 DIAGNOSIS — O99820 Streptococcus B carrier state complicating pregnancy: Secondary | ICD-10-CM | POA: Insufficient documentation

## 2021-04-10 ENCOUNTER — Inpatient Hospital Stay (HOSPITAL_COMMUNITY)
Admission: AD | Admit: 2021-04-10 | Discharge: 2021-04-14 | DRG: 768 | Disposition: A | Discharge status: Home / Self Care | Payer: Medicaid Other | Attending: Obstetrics and Gynecology | Admitting: Obstetrics and Gynecology

## 2021-04-10 ENCOUNTER — Other Ambulatory Visit: Payer: Self-pay

## 2021-04-10 ENCOUNTER — Encounter (HOSPITAL_COMMUNITY): Payer: Self-pay | Admitting: Family Medicine

## 2021-04-10 ENCOUNTER — Other Ambulatory Visit: Payer: Self-pay | Admitting: Advanced Practice Midwife

## 2021-04-10 DIAGNOSIS — O1414 Severe pre-eclampsia complicating childbirth: Secondary | ICD-10-CM | POA: Diagnosis not present

## 2021-04-10 DIAGNOSIS — O99824 Streptococcus B carrier state complicating childbirth: Secondary | ICD-10-CM | POA: Diagnosis present

## 2021-04-10 DIAGNOSIS — O1213 Gestational proteinuria, third trimester: Secondary | ICD-10-CM | POA: Diagnosis not present

## 2021-04-10 DIAGNOSIS — O1404 Mild to moderate pre-eclampsia, complicating childbirth: Secondary | ICD-10-CM | POA: Diagnosis present

## 2021-04-10 DIAGNOSIS — O9081 Anemia of the puerperium: Secondary | ICD-10-CM | POA: Diagnosis not present

## 2021-04-10 DIAGNOSIS — O133 Gestational [pregnancy-induced] hypertension without significant proteinuria, third trimester: Secondary | ICD-10-CM | POA: Diagnosis not present

## 2021-04-10 DIAGNOSIS — Z3A37 37 weeks gestation of pregnancy: Secondary | ICD-10-CM

## 2021-04-10 DIAGNOSIS — O1490 Unspecified pre-eclampsia, unspecified trimester: Secondary | ICD-10-CM | POA: Diagnosis present

## 2021-04-10 DIAGNOSIS — D62 Acute posthemorrhagic anemia: Secondary | ICD-10-CM | POA: Diagnosis not present

## 2021-04-10 DIAGNOSIS — Z20822 Contact with and (suspected) exposure to covid-19: Secondary | ICD-10-CM | POA: Diagnosis present

## 2021-04-10 DIAGNOSIS — O09899 Supervision of other high risk pregnancies, unspecified trimester: Secondary | ICD-10-CM

## 2021-04-10 DIAGNOSIS — O1493 Unspecified pre-eclampsia, third trimester: Secondary | ICD-10-CM

## 2021-04-10 DIAGNOSIS — Z2839 Other underimmunization status: Secondary | ICD-10-CM

## 2021-04-10 DIAGNOSIS — O99820 Streptococcus B carrier state complicating pregnancy: Secondary | ICD-10-CM | POA: Diagnosis not present

## 2021-04-10 LAB — GROUP B STREP BY PCR: Group B strep by PCR: POSITIVE — AB

## 2021-04-10 LAB — CBC WITH DIFFERENTIAL/PLATELET
Abs Immature Granulocytes: 0.04 10*3/uL (ref 0.00–0.07)
Basophils Absolute: 0 10*3/uL (ref 0.0–0.1)
Basophils Relative: 1 %
Eosinophils Absolute: 0.2 10*3/uL (ref 0.0–0.5)
Eosinophils Relative: 2 %
HCT: 34.7 % — ABNORMAL LOW (ref 36.0–46.0)
Hemoglobin: 11.2 g/dL — ABNORMAL LOW (ref 12.0–15.0)
Immature Granulocytes: 1 %
Lymphocytes Relative: 22 %
Lymphs Abs: 1.7 10*3/uL (ref 0.7–4.0)
MCH: 25.4 pg — ABNORMAL LOW (ref 26.0–34.0)
MCHC: 32.3 g/dL (ref 30.0–36.0)
MCV: 78.7 fL — ABNORMAL LOW (ref 80.0–100.0)
Monocytes Absolute: 0.6 10*3/uL (ref 0.1–1.0)
Monocytes Relative: 7 %
Neutro Abs: 5.5 10*3/uL (ref 1.7–7.7)
Neutrophils Relative %: 67 %
Platelets: 281 10*3/uL (ref 150–400)
RBC: 4.41 MIL/uL (ref 3.87–5.11)
RDW: 14.3 % (ref 11.5–15.5)
WBC: 8 10*3/uL (ref 4.0–10.5)
nRBC: 0 % (ref 0.0–0.2)

## 2021-04-10 LAB — RAPID URINE DRUG SCREEN, HOSP PERFORMED
Amphetamines: NOT DETECTED
Barbiturates: NOT DETECTED
Benzodiazepines: NOT DETECTED
Cocaine: NOT DETECTED
Opiates: NOT DETECTED
Tetrahydrocannabinol: NOT DETECTED

## 2021-04-10 LAB — URINALYSIS, ROUTINE W REFLEX MICROSCOPIC
Bilirubin Urine: NEGATIVE
Glucose, UA: NEGATIVE mg/dL
Hgb urine dipstick: NEGATIVE
Ketones, ur: NEGATIVE mg/dL
Leukocytes,Ua: NEGATIVE
Nitrite: NEGATIVE
Protein, ur: 30 mg/dL — AB
Specific Gravity, Urine: 1.014 (ref 1.005–1.030)
pH: 6 (ref 5.0–8.0)

## 2021-04-10 LAB — RESP PANEL BY RT-PCR (FLU A&B, COVID) ARPGX2
Influenza A by PCR: NEGATIVE
Influenza B by PCR: NEGATIVE
SARS Coronavirus 2 by RT PCR: NEGATIVE

## 2021-04-10 LAB — COMPREHENSIVE METABOLIC PANEL
ALT: 10 U/L (ref 0–44)
AST: 18 U/L (ref 15–41)
Albumin: 2.7 g/dL — ABNORMAL LOW (ref 3.5–5.0)
Alkaline Phosphatase: 132 U/L — ABNORMAL HIGH (ref 38–126)
Anion gap: 9 (ref 5–15)
BUN: 5 mg/dL — ABNORMAL LOW (ref 6–20)
CO2: 20 mmol/L — ABNORMAL LOW (ref 22–32)
Calcium: 8.8 mg/dL — ABNORMAL LOW (ref 8.9–10.3)
Chloride: 105 mmol/L (ref 98–111)
Creatinine, Ser: 0.58 mg/dL (ref 0.44–1.00)
GFR, Estimated: >60 mL/min (ref >60)
Glucose, Bld: 104 mg/dL — ABNORMAL HIGH (ref 70–99)
Potassium: 3.6 mmol/L (ref 3.5–5.1)
Sodium: 134 mmol/L — ABNORMAL LOW (ref 135–145)
Total Bilirubin: 0.3 mg/dL (ref 0.3–1.2)
Total Protein: 6.1 g/dL — ABNORMAL LOW (ref 6.5–8.1)

## 2021-04-10 LAB — AMNISURE RUPTURE OF MEMBRANE (ROM) NOT AT ARMC: Amnisure ROM: NEGATIVE

## 2021-04-10 LAB — PROTEIN / CREATININE RATIO, URINE
Creatinine, Urine: 96.11 mg/dL
Protein Creatinine Ratio: 0.37 mg/mg{Cre} — ABNORMAL HIGH (ref 0.00–0.15)
Total Protein, Urine: 36 mg/dL

## 2021-04-10 MED ORDER — HYDRALAZINE HCL 20 MG/ML IJ SOLN: 10.0000 mg | INTRAMUSCULAR | Status: DC | PRN

## 2021-04-10 MED ORDER — LABETALOL HCL 5 MG/ML IV SOLN
20.0000 mg | INTRAVENOUS | Status: DC | PRN
  Administered 2021-04-11 – 2021-04-12 (×2): 20 mg via INTRAVENOUS
  Filled 2021-04-10 (×2): qty 4

## 2021-04-10 MED ORDER — MISOPROSTOL 50MCG HALF TABLET
50.0000 ug | ORAL_TABLET | ORAL | Status: DC | PRN
  Administered 2021-04-10 – 2021-04-11 (×2): 50 ug via BUCCAL
  Filled 2021-04-10 (×2): qty 1

## 2021-04-10 MED ORDER — OXYTOCIN-SODIUM CHLORIDE 30-0.9 UT/500ML-% IV SOLN
2.5000 [IU]/h | INTRAVENOUS | Status: DC
  Administered 2021-04-12: 12:00:00 2.5 [IU]/h via INTRAVENOUS

## 2021-04-10 MED ORDER — PENICILLIN G POT IN DEXTROSE 60000 UNIT/ML IV SOLN
3.0000 10*6.[IU] | INTRAVENOUS | Status: DC
  Administered 2021-04-11 – 2021-04-12 (×7): 3 10*6.[IU] via INTRAVENOUS
  Filled 2021-04-10 (×9): qty 50

## 2021-04-10 MED ORDER — OXYCODONE-ACETAMINOPHEN 5-325 MG PO TABS: 2.0000 | ORAL_TABLET | ORAL | Status: DC | PRN

## 2021-04-10 MED ORDER — LACTATED RINGERS IV SOLN: INTRAVENOUS | Status: DC

## 2021-04-10 MED ORDER — LACTATED RINGERS IV SOLN
500.0000 mL | INTRAVENOUS | Status: DC | PRN
  Administered 2021-04-11: 19:00:00 500 mL via INTRAVENOUS

## 2021-04-10 MED ORDER — LIDOCAINE HCL (PF) 1 % IJ SOLN: 30.0000 mL | INTRAMUSCULAR | Status: DC | PRN

## 2021-04-10 MED ORDER — SOD CITRATE-CITRIC ACID 500-334 MG/5ML PO SOLN: 30.0000 mL | ORAL | Status: DC | PRN

## 2021-04-10 MED ORDER — OXYTOCIN BOLUS FROM INFUSION
333.0000 mL | Freq: Once | INTRAVENOUS | Status: AC
  Administered 2021-04-12: 11:00:00 333 mL via INTRAVENOUS

## 2021-04-10 MED ORDER — ACETAMINOPHEN 325 MG PO TABS
650.0000 mg | ORAL_TABLET | ORAL | Status: DC | PRN
  Administered 2021-04-12 (×3): 650 mg via ORAL
  Filled 2021-04-10 (×3): qty 2

## 2021-04-10 MED ORDER — OXYCODONE-ACETAMINOPHEN 5-325 MG PO TABS: 1.0000 | ORAL_TABLET | ORAL | Status: DC | PRN

## 2021-04-10 MED ORDER — TERBUTALINE SULFATE 1 MG/ML IJ SOLN: 0.2500 mg | Freq: Once | INTRAMUSCULAR | Status: DC | PRN

## 2021-04-10 MED ORDER — LABETALOL HCL 5 MG/ML IV SOLN: 40.0000 mg | INTRAVENOUS | Status: DC | PRN

## 2021-04-10 MED ORDER — SODIUM CHLORIDE 0.9 % IV SOLN
5.0000 10*6.[IU] | Freq: Once | INTRAVENOUS | Status: AC
  Administered 2021-04-10: 23:00:00 5 10*6.[IU] via INTRAVENOUS
  Filled 2021-04-10: qty 5

## 2021-04-10 MED ORDER — ONDANSETRON HCL 4 MG/2ML IJ SOLN: 4.0000 mg | Freq: Four times a day (QID) | INTRAMUSCULAR | Status: DC | PRN

## 2021-04-10 MED ORDER — ZOLPIDEM TARTRATE 5 MG PO TABS
5.0000 mg | ORAL_TABLET | Freq: Every evening | ORAL | Status: DC | PRN
  Administered 2021-04-11: 01:00:00 5 mg via ORAL
  Filled 2021-04-10: qty 1

## 2021-04-10 MED ORDER — FENTANYL CITRATE (PF) 100 MCG/2ML IJ SOLN
50.0000 ug | INTRAMUSCULAR | Status: DC | PRN
  Administered 2021-04-11 (×3): 100 ug via INTRAVENOUS
  Filled 2021-04-10 (×3): qty 2

## 2021-04-10 MED ORDER — LABETALOL HCL 5 MG/ML IV SOLN: 80.0000 mg | INTRAVENOUS | Status: DC | PRN

## 2021-04-10 NOTE — MAU Provider Note (Addendum)
History     CSN: [id number]  Arrival date and time: 04/10/21 1424   Event Date/Time   First Provider Initiated Contact with Patient 04/10/21 1503      Chief Complaint  Patient presents with   Rupture of Membranes   Dizziness   Monica Burgess is a 25 y.o. G1P0 at [redacted]w[redacted]d who presents today with various concerns. She reports that she is having contractions and leaking fluid that started today. She also reports that she is under a lot of stress, and feels dizzy and disoriented. She states that she has been getting prenatal care at the [facility] health department and denies any complications. There are no records on file, but she has had 3 USs with [facility], and they confirm EDC of 04/29/2021.   Dizziness This is a new problem. The current episode started yesterday. The problem occurs intermittently. The problem has been unchanged. Nothing aggravates the symptoms. She has tried nothing for the symptoms.  Vaginal Discharge The patient's primary symptoms include pelvic pain and vaginal discharge. This is a new problem. The current episode started today. The problem occurs intermittently. The problem has been unchanged. The pain is moderate. The problem affects both sides. She is pregnant. The vaginal discharge was watery. There has been no bleeding. Nothing aggravates the symptoms. She has tried nothing for the symptoms.   OB History     Gravida  1   Para      Term      Preterm      AB      Living         SAB      IAB      Ectopic      Multiple      Live Births              Past Medical History:  Diagnosis Date   Asthma     History reviewed. No pertinent surgical history.  Family History  Problem Relation Age of Onset   Cancer Mother     Social History   Tobacco Use   Smoking status: Never  Vaping Use   Vaping Use: Never used  Substance Use Topics   Alcohol use: No   Drug use: No    Allergies: No Known Allergies  Medications Prior to Admission   Medication Sig Dispense Refill Last Dose   ferrous sulfate 325 (65 FE) MG EC tablet Take 325 mg by mouth 3 (three) times daily with meals.   04/10/2021   Prenatal Vit-Fe Fumarate-FA (MULTIVITAMIN-PRENATAL) 27-0.8 MG TABS tablet Take 1 tablet by mouth daily at 12 noon.   04/10/2021   acetaminophen (TYLENOL) 500 MG tablet Take 1,000 mg by mouth every 6 (six) hours as needed for mild pain or headache.      albuterol (VENTOLIN HFA) 108 (90 Base) MCG/ACT inhaler Inhale 2 puffs into the lungs every 4 (four) hours as needed for wheezing or shortness of breath. 1 each 0    loratadine (CLARITIN) 10 MG tablet Take 1 tablet (10 mg total) by mouth daily. (Patient taking differently: Take 10 mg by mouth daily as needed for allergies.) 30 tablet 11    mupirocin ointment (BACTROBAN) 2 % Apply 1 application topically 2 (two) times daily. 22 g 1    triamcinolone ointment (KENALOG) 0.5 % Apply 1 application topically 2 (two) times daily. Apply to arms (Patient taking differently: Apply 1 application topically 2 (two) times daily as needed (eczema). Apply to arms) 30 g 0  Review of Systems  Genitourinary:  Positive for pelvic pain and vaginal discharge.  Neurological:  Positive for dizziness.  All other systems reviewed and are negative. Physical Exam   Blood pressure 132/80, pulse 90, temperature 98.3 F (36.8 C), temperature source Oral, resp. rate 18, height 5\' 2"  (1.575 m), weight 95.2 kg, last menstrual period 07/02/2020, SpO2 99 %.  Physical Exam Constitutional:      Appearance: She is well-developed.  HENT:     Head: Normocephalic.  Eyes:     Pupils: Pupils are equal, round, and reactive to light.  Cardiovascular:     Rate and Rhythm: Normal rate and regular rhythm.     Heart sounds: Normal heart sounds.  Pulmonary:     Effort: Pulmonary effort is normal. No respiratory distress.     Breath sounds: Normal breath sounds.  Abdominal:     Palpations: Abdomen is soft.     Tenderness: There is  no abdominal tenderness.  Genitourinary:    Vagina: No bleeding. Vaginal discharge: mucusy.    Comments: External: no lesion Vagina: small amount of white discharge     Musculoskeletal:        General: Normal range of motion.     Cervical back: Normal range of motion and neck supple.  Skin:    General: Skin is warm and dry.  Neurological:     Mental Status: She is alert and oriented to person, place, and time.  Psychiatric:        Mood and Affect: Mood normal.        Behavior: Behavior normal.   NST:  Baseline: 130 Variability: moderate Accels: 15x15 Decels: none Toco: none Reactive/Appropriate for GA  Results for orders placed or performed during the hospital encounter of 04/10/21 (from the past 24 hour(s))  Urinalysis, Routine w reflex microscopic Urine, Clean Catch     Status: Abnormal   Collection Time: 04/10/21  2:39 PM  Result Value Ref Range   Color, Urine YELLOW YELLOW   APPearance CLEAR CLEAR   Specific Gravity, Urine 1.014 1.005 - 1.030   pH 6.0 5.0 - 8.0   Glucose, UA NEGATIVE NEGATIVE mg/dL   Hgb urine dipstick NEGATIVE NEGATIVE   Bilirubin Urine NEGATIVE NEGATIVE   Ketones, ur NEGATIVE NEGATIVE mg/dL   Protein, ur 30 (A) NEGATIVE mg/dL   Nitrite NEGATIVE NEGATIVE   Leukocytes,Ua NEGATIVE NEGATIVE   RBC / HPF 0-5 0 - 5 RBC/hpf   WBC, UA 0-5 0 - 5 WBC/hpf   Bacteria, UA RARE (A) NONE SEEN   Squamous Epithelial / LPF 0-5 0 - 5   Mucus PRESENT   Protein / creatinine ratio, urine     Status: Abnormal   Collection Time: 04/10/21  3:15 PM  Result Value Ref Range   Creatinine, Urine 96.11 mg/dL   Total Protein, Urine 36 mg/dL   Protein Creatinine Ratio 0.37 (H) 0.00 - 0.15 mg/mg[Cre]  Urine rapid drug screen (hosp performed)     Status: None   Collection Time: 04/10/21  3:15 PM  Result Value Ref Range   Opiates NONE DETECTED NONE DETECTED   Cocaine NONE DETECTED NONE DETECTED   Benzodiazepines NONE DETECTED NONE DETECTED   Amphetamines NONE DETECTED  NONE DETECTED   Tetrahydrocannabinol NONE DETECTED NONE DETECTED   Barbiturates NONE DETECTED NONE DETECTED  Amnisure rupture of membrane (rom)not at [facility]     Status: None   Collection Time: 04/10/21  3:16 PM  Result Value Ref Range   Amnisure ROM NEGATIVE  CBC with Differential/Platelet     Status: Abnormal   Collection Time: 04/10/21  3:36 PM  Result Value Ref Range   WBC 8.0 4.0 - 10.5 K/uL   RBC 4.41 3.87 - 5.11 MIL/uL   Hemoglobin 11.2 (L) 12.0 - 15.0 g/dL   HCT 34.7 (L) 36.0 - 46.0 %   MCV 78.7 (L) 80.0 - 100.0 fL   MCH 25.4 (L) 26.0 - 34.0 pg   MCHC 32.3 30.0 - 36.0 g/dL   RDW 14.3 11.5 - 15.5 %   Platelets 281 150 - 400 K/uL   nRBC 0.0 0.0 - 0.2 %   Neutrophils Relative % 67 %   Neutro Abs 5.5 1.7 - 7.7 K/uL   Lymphocytes Relative 22 %   Lymphs Abs 1.7 0.7 - 4.0 K/uL   Monocytes Relative 7 %   Monocytes Absolute 0.6 0.1 - 1.0 K/uL   Eosinophils Relative 2 %   Eosinophils Absolute 0.2 0.0 - 0.5 K/uL   Basophils Relative 1 %   Basophils Absolute 0.0 0.0 - 0.1 K/uL   Immature Granulocytes 1 %   Abs Immature Granulocytes 0.04 0.00 - 0.07 K/uL  Comprehensive metabolic panel     Status: Abnormal   Collection Time: 04/10/21  3:36 PM  Result Value Ref Range   Sodium 134 (L) 135 - 145 mmol/L   Potassium 3.6 3.5 - 5.1 mmol/L   Chloride 105 98 - 111 mmol/L   CO2 20 (L) 22 - 32 mmol/L   Glucose, Bld 104 (H) 70 - 99 mg/dL   BUN 5 (L) 6 - 20 mg/dL   Creatinine, Ser 0.58 0.44 - 1.00 mg/dL   Calcium 8.8 (L) 8.9 - 10.3 mg/dL   Total Protein 6.1 (L) 6.5 - 8.1 g/dL   Albumin 2.7 (L) 3.5 - 5.0 g/dL   AST 18 15 - 41 U/L   ALT 10 0 - 44 U/L   Alkaline Phosphatase 132 (H) 38 - 126 U/L   Total Bilirubin 0.3 0.3 - 1.2 mg/dL   GFR, Estimated >60 >60 mL/min   Anion gap 9 5 - 15    Patient Vitals for the past 24 hrs:  BP Temp Temp src Pulse Resp SpO2 Height Weight  04/10/21 1859 140/75 -- -- 96 -- -- -- --  04/10/21 1831 140/64 -- -- 92 -- -- -- --  04/10/21 1816 (!) 150/60 --  -- 83 -- -- -- --  04/10/21 1801 (!) 144/76 -- -- 87 -- -- -- --  04/10/21 1746 132/80 -- -- 90 -- -- -- --  04/10/21 1733 138/74 -- -- 88 -- 99 % -- --  04/10/21 1716 138/74 -- -- 86 -- -- -- --  04/10/21 1646 (!) 119/49 -- -- 94 -- -- -- --  04/10/21 1631 131/71 -- -- (!) 104 -- -- -- --  04/10/21 1616 127/66 -- -- 92 -- -- -- --  04/10/21 1516 129/73 -- -- 86 -- -- -- --  04/10/21 1502 (!) 119/49 -- -- 73 -- -- -- --  04/10/21 1456 (!) 142/74 -- -- 84 -- 96 % -- --  04/10/21 1440 135/72 98.3 F (36.8 C) Oral 77 18 99 % -- --  04/10/21 1433 -- -- -- -- -- -- 5\' 2"  (1.575 m) 95.2 kg      MAU Course  Procedures  MDM Elevated BP >4 hours apart with proteinuria, meets criteria for pre-eclampsia    Assessment and Plan   1. Transient hypertension of pregnancy in third  trimester   2. Gestational proteinuria in third trimester   3. [redacted] weeks gestation of pregnancy   4. Pre-eclampsia in third trimester    Admit to labor and delivery for IOL 2/2 pre-eclampsia  Labor team notified    Marcille Buffy DNP, CNM  04/10/21  6:07 PM

## 2021-04-10 NOTE — H&P (Signed)
Monica Burgess is a 24 y.o. female G1P0 with IUP at [redacted]w[redacted]d presenting for IOL for preeclampsia w/mild features.  She came to the MAU today with c/o:  having contractions and leaking fluid that started today. She also reports that she is under a lot of stress, and feels dizzy and disoriented. She states that she has been getting prenatal care at the [facility] health department and denies any complications. There are no records on file, but she has had 3 USs with [facility], and they confirm EDC of 04/29/2021. ROM was ruled out, but was also found to be hypertensive w/elevated P/R ratio.   Prenatal History/Complications:  Late PNC (18 weeks per [place] report) None per pt   Past Medical History: Past Medical History:  Diagnosis Date   Asthma     Past Surgical History: History reviewed. No pertinent surgical history.  Obstetrical History: OB History     Gravida  1   Para      Term      Preterm      AB      Living         SAB      IAB      Ectopic      Multiple      Live Births              Social History: Social History   Socioeconomic History   Marital status: Single    Spouse name: Not on file   Number of children: Not on file   Years of education: Not on file   Highest education level: Not on file  Occupational History   Not on file  Tobacco Use   Smoking status: Never   Smokeless tobacco: Not on file  Vaping Use   Vaping Use: Never used  Substance and Sexual Activity   Alcohol use: No   Drug use: No   Sexual activity: Yes  Other Topics Concern   Not on file  Social History Narrative   Not on file   Social Determinants of Health   Financial Resource Strain: Not on file  Food Insecurity: Not on file  Transportation Needs: Not on file  Physical Activity: Not on file  Stress: Not on file  Social Connections: Not on file    Family History: Family History  Problem Relation Age of Onset   Cancer Mother     Allergies: No Known  Allergies  Medications Prior to Admission  Medication Sig Dispense Refill Last Dose   ferrous sulfate 325 (65 FE) MG EC tablet Take 325 mg by mouth 3 (three) times daily with meals.   04/10/2021   Prenatal Vit-Fe Fumarate-FA (MULTIVITAMIN-PRENATAL) 27-0.8 MG TABS tablet Take 1 tablet by mouth daily at 12 noon.   04/10/2021   acetaminophen (TYLENOL) 500 MG tablet Take 1,000 mg by mouth every 6 (six) hours as needed for mild pain or headache.      albuterol (VENTOLIN HFA) 108 (90 Base) MCG/ACT inhaler Inhale 2 puffs into the lungs every 4 (four) hours as needed for wheezing or shortness of breath. 1 each 0    loratadine (CLARITIN) 10 MG tablet Take 1 tablet (10 mg total) by mouth daily. (Patient taking differently: Take 10 mg by mouth daily as needed for allergies.) 30 tablet 11    mupirocin ointment (BACTROBAN) 2 % Apply 1 application topically 2 (two) times daily. 22 g 1    triamcinolone ointment (KENALOG) 0.5 % Apply 1 application topically 2 (two) times daily.  Apply to arms (Patient taking differently: Apply 1 application topically 2 (two) times daily as needed (eczema). Apply to arms) 30 g 0         Review of Systems   Constitutional: Negative for fever and chills Eyes: Negative for visual disturbances Respiratory: Negative for shortness of breath, dyspnea Cardiovascular: Negative for chest pain or palpitations  Gastrointestinal: Negative for abdominal pain, vomiting, diarrhea and constipation.   Genitourinary: Negative for dysuria and urgency Musculoskeletal: Negative for back pain, joint pain, myalgias  Neurological: Negative forheadaches      Blood pressure 139/82, pulse 80, temperature 98.1 F (36.7 C), temperature source Oral, resp. rate 16, height 5\' 2"  (1.575 m), weight 95.2 kg, last menstrual period 07/02/2020, SpO2 99 %. General appearance: alert, cooperative, and no distress Lungs: normal respiratory effort Heart: regular rate and rhythm Abdomen: soft, non-tender;  bowel sounds normal Extremities: Homans sign is negative, no sign of DVT DTR's 2+ Presentation: cephalic Fetal monitoring  Baseline: 140 bpm, Variability: Good {> 6 bpm), Accelerations: Reactive, and Decelerations: Absent Uterine activity  None  Dilation: 1 Effacement (%): Thick Station: Ballotable Exam by:: [email or web address], CNM   Prenatal labs: ABO, Rh: --/--/AB POS (12/22 1536) Antibody: NEG (12/22 1536)neg Rubella:non immune RPR:   neg HBsAg:   neg HIV:   neg GBS: POSITIVE/-- (12/22 2055) POSITIVE 1 hr Glucola 180, NO 3 HR on record, pt states she passed it Genetic screening  none Anatomy 2056 normal  Prenatal Transfer Tool  Maternal Diabetes: No Genetic Screening: Declined Maternal Ultrasounds/Referrals: Normal Fetal Ultrasounds or other Referrals:  None Maternal Substance Abuse:  No Significant Maternal Medications:  None Significant Maternal Lab Results: Group B Strep positive    Results for orders placed or performed during the hospital encounter of 04/10/21 (from the past 24 hour(s))  Urinalysis, Routine w reflex microscopic Urine, Clean Catch   Collection Time: 04/10/21  2:39 PM  Result Value Ref Range   Color, Urine YELLOW YELLOW   APPearance CLEAR CLEAR   Specific Gravity, Urine 1.014 1.005 - 1.030   pH 6.0 5.0 - 8.0   Glucose, UA NEGATIVE NEGATIVE mg/dL   Hgb urine dipstick NEGATIVE NEGATIVE   Bilirubin Urine NEGATIVE NEGATIVE   Ketones, ur NEGATIVE NEGATIVE mg/dL   Protein, ur 30 (A) NEGATIVE mg/dL   Nitrite NEGATIVE NEGATIVE   Leukocytes,Ua NEGATIVE NEGATIVE   RBC / HPF 0-5 0 - 5 RBC/hpf   WBC, UA 0-5 0 - 5 WBC/hpf   Bacteria, UA RARE (A) NONE SEEN   Squamous Epithelial / LPF 0-5 0 - 5   Mucus PRESENT   Protein / creatinine ratio, urine   Collection Time: 04/10/21  3:15 PM  Result Value Ref Range   Creatinine, Urine 96.11 mg/dL   Total Protein, Urine 36 mg/dL   Protein Creatinine Ratio 0.37 (H) 0.00 - 0.15 mg/mg[Cre]  Urine rapid drug screen  (hosp performed)   Collection Time: 04/10/21  3:15 PM  Result Value Ref Range   Opiates NONE DETECTED NONE DETECTED   Cocaine NONE DETECTED NONE DETECTED   Benzodiazepines NONE DETECTED NONE DETECTED   Amphetamines NONE DETECTED NONE DETECTED   Tetrahydrocannabinol NONE DETECTED NONE DETECTED   Barbiturates NONE DETECTED NONE DETECTED  Amnisure rupture of membrane (rom)not at [facility]   Collection Time: 04/10/21  3:16 PM  Result Value Ref Range   Amnisure ROM NEGATIVE   CBC with Differential/Platelet   Collection Time: 04/10/21  3:36 PM  Result Value Ref Range   WBC 8.0 4.0 -  10.5 K/uL   RBC 4.41 3.87 - 5.11 MIL/uL   Hemoglobin 11.2 (L) 12.0 - 15.0 g/dL   HCT 63.7 (L) 85.8 - 85.0 %   MCV 78.7 (L) 80.0 - 100.0 fL   MCH 25.4 (L) 26.0 - 34.0 pg   MCHC 32.3 30.0 - 36.0 g/dL   RDW 27.7 41.2 - 87.8 %   Platelets 281 150 - 400 K/uL   nRBC 0.0 0.0 - 0.2 %   Neutrophils Relative % 67 %   Neutro Abs 5.5 1.7 - 7.7 K/uL   Lymphocytes Relative 22 %   Lymphs Abs 1.7 0.7 - 4.0 K/uL   Monocytes Relative 7 %   Monocytes Absolute 0.6 0.1 - 1.0 K/uL   Eosinophils Relative 2 %   Eosinophils Absolute 0.2 0.0 - 0.5 K/uL   Basophils Relative 1 %   Basophils Absolute 0.0 0.0 - 0.1 K/uL   Immature Granulocytes 1 %   Abs Immature Granulocytes 0.04 0.00 - 0.07 K/uL  Comprehensive metabolic panel   Collection Time: 04/10/21  3:36 PM  Result Value Ref Range   Sodium 134 (L) 135 - 145 mmol/L   Potassium 3.6 3.5 - 5.1 mmol/L   Chloride 105 98 - 111 mmol/L   CO2 20 (L) 22 - 32 mmol/L   Glucose, Bld 104 (H) 70 - 99 mg/dL   BUN 5 (L) 6 - 20 mg/dL   Creatinine, Ser 6.76 0.44 - 1.00 mg/dL   Calcium 8.8 (L) 8.9 - 10.3 mg/dL   Total Protein 6.1 (L) 6.5 - 8.1 g/dL   Albumin 2.7 (L) 3.5 - 5.0 g/dL   AST 18 15 - 41 U/L   ALT 10 0 - 44 U/L   Alkaline Phosphatase 132 (H) 38 - 126 U/L   Total Bilirubin 0.3 0.3 - 1.2 mg/dL   GFR, Estimated >72 >09 mL/min   Anion gap 9 5 - 15  Type and screen [facility]   Collection Time: 04/10/21  3:36 PM  Result Value Ref Range   ABO/RH(D) AB POS    Antibody Screen NEG    Sample Expiration      04/13/2021,2359 Performed at [facility], [address], [place], [place] [address]   Resp Panel by RT-PCR (Flu A&B, Covid) Nasopharyngeal Swab   Collection Time: 04/10/21  7:06 PM   Specimen: Nasopharyngeal Swab; Nasopharyngeal(NP) swabs in vial transport medium  Result Value Ref Range   SARS Coronavirus 2 by RT PCR NEGATIVE NEGATIVE   Influenza A by PCR NEGATIVE NEGATIVE   Influenza B by PCR NEGATIVE NEGATIVE  Group B strep by PCR   Collection Time: 04/10/21  8:55 PM   Specimen: Vaginal/Rectal; Genital  Result Value Ref Range   Group B strep by PCR POSITIVE (A) NEGATIVE    Assessment: Monica Burgess is a 25 y.o. G1P0 with an IUP at [redacted]w[redacted]d presenting for IOL for mild preeclampsia.  Plan: #Labor: Cytotec->Cooks catheter inserted and inflated w/80cc H20->plan pitocin #Pain:  Per request #FWB Cat 1 #ID: GBS: PCN  #MOF:  both #MOC: unsure, discussed    Jacklyn Shell 04/10/2021, 10:47 PM

## 2021-04-10 NOTE — MAU Note (Signed)
Monica Burgess is a 25 y.o. at [redacted]w[redacted]d here in MAU reporting: has been shaky, dizzy, and disoriented today. Has had a little bit of LOF yesterday and today, none since this AM. No bleeding. No pain. +FM  Onset of complaint: yesterday  Pain score: 0/10  Vitals:   04/10/21 1440  BP: 135/72  Pulse: 77  Resp: 18  Temp: 98.3 F (36.8 C)  SpO2: 99%     FHT:140  Lab orders placed from triage: UA

## 2021-04-11 ENCOUNTER — Inpatient Hospital Stay (HOSPITAL_COMMUNITY): Payer: Medicaid Other | Admitting: Anesthesiology

## 2021-04-11 LAB — CBC
HCT: 33.5 % — ABNORMAL LOW (ref 36.0–46.0)
Hemoglobin: 10.5 g/dL — ABNORMAL LOW (ref 12.0–15.0)
MCH: 24.8 pg — ABNORMAL LOW (ref 26.0–34.0)
MCHC: 31.3 g/dL (ref 30.0–36.0)
MCV: 79.2 fL — ABNORMAL LOW (ref 80.0–100.0)
Platelets: 248 10*3/uL (ref 150–400)
RBC: 4.23 MIL/uL (ref 3.87–5.11)
RDW: 14.3 % (ref 11.5–15.5)
WBC: 12.5 10*3/uL — ABNORMAL HIGH (ref 4.0–10.5)
nRBC: 0 % (ref 0.0–0.2)

## 2021-04-11 LAB — TYPE AND SCREEN
ABO/RH(D): AB POS
Antibody Screen: NEGATIVE

## 2021-04-11 LAB — RPR: RPR Ser Ql: NONREACTIVE

## 2021-04-11 MED ORDER — LIDOCAINE HCL (PF) 1 % IJ SOLN
INTRAMUSCULAR | Status: DC | PRN
  Administered 2021-04-11: 2 mL via EPIDURAL
  Administered 2021-04-11: 10 mL via EPIDURAL

## 2021-04-11 MED ORDER — LACTATED RINGERS IV SOLN: 500.0000 mL | Freq: Once | INTRAVENOUS | Status: DC

## 2021-04-11 MED ORDER — TERBUTALINE SULFATE 1 MG/ML IJ SOLN: 0.2500 mg | Freq: Once | INTRAMUSCULAR | Status: DC | PRN

## 2021-04-11 MED ORDER — FENTANYL-BUPIVACAINE-NACL 0.5-0.125-0.9 MG/250ML-% EP SOLN
EPIDURAL | Status: DC | PRN
  Administered 2021-04-11: 12 mL/h via EPIDURAL

## 2021-04-11 MED ORDER — DIPHENHYDRAMINE HCL 50 MG/ML IJ SOLN: 12.5000 mg | INTRAMUSCULAR | Status: DC | PRN

## 2021-04-11 MED ORDER — OXYTOCIN-SODIUM CHLORIDE 30-0.9 UT/500ML-% IV SOLN
1.0000 m[IU]/min | INTRAVENOUS | Status: DC
  Administered 2021-04-11: 11:00:00 8 m[IU]/min via INTRAVENOUS
  Administered 2021-04-11: 10:00:00 4 m[IU]/min via INTRAVENOUS
  Administered 2021-04-11: 08:00:00 2 m[IU]/min via INTRAVENOUS
  Filled 2021-04-11 (×2): qty 500

## 2021-04-11 MED ORDER — EPHEDRINE 5 MG/ML INJ: 10.0000 mg | INTRAVENOUS | Status: DC | PRN

## 2021-04-11 MED ORDER — PHENYLEPHRINE 40 MCG/ML (10ML) SYRINGE FOR IV PUSH (FOR BLOOD PRESSURE SUPPORT)
80.0000 ug | PREFILLED_SYRINGE | INTRAVENOUS | Status: DC | PRN
  Filled 2021-04-11: qty 10

## 2021-04-11 MED ORDER — PHENYLEPHRINE 40 MCG/ML (10ML) SYRINGE FOR IV PUSH (FOR BLOOD PRESSURE SUPPORT): 80.0000 ug | PREFILLED_SYRINGE | INTRAVENOUS | Status: DC | PRN

## 2021-04-11 MED ORDER — FENTANYL-BUPIVACAINE-NACL 0.5-0.125-0.9 MG/250ML-% EP SOLN
12.0000 mL/h | EPIDURAL | Status: DC | PRN
  Filled 2021-04-11 (×2): qty 250

## 2021-04-11 NOTE — Progress Notes (Signed)
Monica Burgess is a 25 y.o. G1P0 at [redacted]w[redacted]d for IOL d/t mild preE  Subjective: She is becoming more uncomfortable with her contractions.   Objective: BP (!) 124/48    Pulse 72    Temp 98.3 F (36.8 C) (Oral)    Resp 18    Ht 5\' 2"  (1.575 m)    Wt 95.2 kg    LMP 07/02/2020    SpO2 99%    BMI 38.39 kg/m  No intake/output data recorded. No intake/output data recorded.  FHT:  FHR: 130 bpm, variability: moderate,  accelerations:  Present,  decelerations:  Absent UC:   regular, every 2-3 minutes SVE:   Dilation: 5 Effacement (%): 70 Station: -2 Exam by:: MD [email or web address] AROM to clear fluid   Labs: Lab Results  Component Value Date   WBC 8.0 04/10/2021   HGB 11.2 (L) 04/10/2021   HCT 34.7 (L) 04/10/2021   MCV 78.7 (L) 04/10/2021   PLT 281 04/10/2021    Assessment / Plan: Induction of labor due to preeclampsia,  progressing well on pitocin  Labor:  AROM done  Preeclampsia:   Labs normal, no severe features, Bps normotensive to mild range Fetal Wellbeing:  Category I Pain Control:  Epidural I/D:   PCN for GBS Anticipated MOD:  NSVD  04/12/2021 04/11/2021, 6:59 PM

## 2021-04-11 NOTE — Progress Notes (Signed)
Labor Progress Note Monica Burgess is a 25 y.o. G1P0 at [redacted]w[redacted]d presented for IOL due mild pre-e   S: Doing well, not feeling any contractions much yet.   O:  BP 130/73    Pulse 87    Temp 98.2 F (36.8 C) (Oral)    Resp 18    Ht 5\' 2"  (1.575 m)    Wt 95.2 kg    LMP 07/02/2020    SpO2 99%    BMI 38.39 kg/m  EFM: 135/mod/15x15  CVE: Dilation: 5.5 Effacement (%): 60 Station: -3 Presentation: Vertex Exam by:: Dondre Catalfamo   A&P: 25 y.o. G1P0 [redacted]w[redacted]d  #Labor: Minimal progression since last check, quite posterior in the pelvis with head minimally applied to cervix. Will cont to titrate pit for more favorable contraction pattern and hopeful for AROM on next check.  #Pain: PRN #FWB: Cat I  #GBS positive   #Pre-e without severe features: BP 130/73 most recently. Pre-e labs WNL. No current symptoms. Cont to monitor.     [redacted]w[redacted]d, DO 2:23 PM

## 2021-04-11 NOTE — Progress Notes (Signed)
S: Patient resting in bed comfortable in bed with epidural. No concerns at this time. Anticipating the arrival of baby girl [place].   O: Vitals:   04/11/21 2200 04/11/21 2201 04/11/21 2231 04/11/21 2314  BP:  135/80 138/78 116/62  Pulse:  81 83 77  Resp:  16 16   Temp: 98.6 F (37 C)     TempSrc: Oral     SpO2:      Weight:      Height:        FHT:  FHR: 135 bpm, variability: moderate,  accelerations:  Present,  decelerations:  Absent UC:   regular, every 2-4 minutes SVE:   Dilation: 5 Effacement (%): 70 Station: -2 Exam by:: [place] RN  A / P: 25 y.o. G1P0 [redacted]w[redacted]d Induction of labor due to preeclampsia,  cervix unchanged from last exam.  IUPC placed for pitocin augmentation  GBS positive> PCN Fetal Wellbeing:  Category I Pain Control:  Epidural Anticipated MOD:  NSVD  Trellis Moment, SNM 04/11/2021, 11:15 PM

## 2021-04-11 NOTE — Anesthesia Preprocedure Evaluation (Signed)
Anesthesia Evaluation  Patient identified by MRN, date of birth, ID band Patient awake    Reviewed: Allergy & Precautions, Patient's Chart, lab work & pertinent test results  Airway Mallampati: II  TM Distance: >3 FB Neck ROM: Full    Dental no notable dental hx.    Pulmonary asthma ,    Pulmonary exam normal breath sounds clear to auscultation       Cardiovascular hypertension (preE), Normal cardiovascular exam Rhythm:Regular Rate:Normal     Neuro/Psych negative neurological ROS  negative psych ROS   GI/Hepatic negative GI ROS, Neg liver ROS,   Endo/Other  Obesity BMI 38  Renal/GU negative Renal ROS  negative genitourinary   Musculoskeletal negative musculoskeletal ROS (+)   Abdominal (+) + obese,   Peds negative pediatric ROS (+)  Hematology negative hematology ROS (+) hct 33.5, plt 248   Anesthesia Other Findings   Reproductive/Obstetrics (+) Pregnancy                             Anesthesia Physical Anesthesia Plan  ASA: 2  Anesthesia Plan: Epidural   Post-op Pain Management:    Induction:   PONV Risk Score and Plan: 2  Airway Management Planned: Natural Airway  Additional Equipment: None  Intra-op Plan:   Post-operative Plan:   Informed Consent: I have reviewed the patients History and Physical, chart, labs and discussed the procedure including the risks, benefits and alternatives for the proposed anesthesia with the patient or authorized representative who has indicated his/her understanding and acceptance.       Plan Discussed with:   Anesthesia Plan Comments:         Anesthesia Quick Evaluation

## 2021-04-11 NOTE — Progress Notes (Signed)
Patient Vitals for the past 4 hrs:  BP Pulse Resp  04/11/21 0701 (!) 142/87 88 16  04/11/21 0600 (!) 143/86 77 16  04/11/21 0511 125/60 73 16  04/11/21 0401 (!) 146/78 70 16   Balloon out about an hour ago.  Cx 5.5/50/-2.  FHR Cat 1.  No ctx.  Will start pitocin

## 2021-04-11 NOTE — Anesthesia Procedure Notes (Signed)
Epidural Patient location during procedure: OB Start time: 04/11/2021 7:45 PM End time: 04/11/2021 7:53 PM  Staffing Anesthesiologist: Lannie Fields, DO Performed: anesthesiologist   Preanesthetic Checklist Completed: patient identified, IV checked, risks and benefits discussed, monitors and equipment checked, pre-op evaluation and timeout performed  Epidural Patient position: sitting Prep: DuraPrep and site prepped and draped Patient monitoring: continuous pulse ox, blood pressure, heart rate and cardiac monitor Approach: midline Location: L3-L4 Injection technique: LOR air  Needle:  Needle type: Tuohy  Needle gauge: 17 G Needle length: 9 cm Needle insertion depth: 7 cm Catheter type: closed end flexible Catheter size: 19 Gauge Catheter at skin depth: 12 cm Test dose: negative  Assessment Sensory level: T8 Events: blood not aspirated, injection not painful, no injection resistance, no paresthesia and negative IV test  Additional Notes Patient identified. Risks/Benefits/Options discussed with patient including but not limited to bleeding, infection, nerve damage, paralysis, failed block, incomplete pain control, headache, blood pressure changes, nausea, vomiting, reactions to medication both or allergic, itching and postpartum back pain. Confirmed with bedside nurse the patient's most recent platelet count. Confirmed with patient that they are not currently taking any anticoagulation, have any bleeding history or any family history of bleeding disorders. Patient expressed understanding and wished to proceed. All questions were answered. Sterile technique was used throughout the entire procedure. Please see nursing notes for vital signs. Test dose was given through epidural catheter and negative prior to continuing to dose epidural or start infusion. Warning signs of high block given to the patient including shortness of breath, tingling/numbness in hands, complete motor  block, or any concerning symptoms with instructions to call for help. Patient was given instructions on fall risk and not to get out of bed. All questions and concerns addressed with instructions to call with any issues or inadequate analgesia.  Reason for block:procedure for pain

## 2021-04-12 ENCOUNTER — Encounter (HOSPITAL_COMMUNITY): Payer: Self-pay | Admitting: Family Medicine

## 2021-04-12 DIAGNOSIS — O99820 Streptococcus B carrier state complicating pregnancy: Secondary | ICD-10-CM

## 2021-04-12 DIAGNOSIS — Z3A37 37 weeks gestation of pregnancy: Secondary | ICD-10-CM

## 2021-04-12 DIAGNOSIS — O1414 Severe pre-eclampsia complicating childbirth: Secondary | ICD-10-CM

## 2021-04-12 LAB — COMPREHENSIVE METABOLIC PANEL
ALT: 12 U/L (ref 0–44)
AST: 20 U/L (ref 15–41)
Albumin: 2.4 g/dL — ABNORMAL LOW (ref 3.5–5.0)
Alkaline Phosphatase: 137 U/L — ABNORMAL HIGH (ref 38–126)
Anion gap: 11 (ref 5–15)
BUN: 5 mg/dL — ABNORMAL LOW (ref 6–20)
CO2: 18 mmol/L — ABNORMAL LOW (ref 22–32)
Calcium: 8.6 mg/dL — ABNORMAL LOW (ref 8.9–10.3)
Chloride: 103 mmol/L (ref 98–111)
Creatinine, Ser: 0.85 mg/dL (ref 0.44–1.00)
GFR, Estimated: >60 mL/min (ref >60)
Glucose, Bld: 129 mg/dL — ABNORMAL HIGH (ref 70–99)
Potassium: 3.5 mmol/L (ref 3.5–5.1)
Sodium: 132 mmol/L — ABNORMAL LOW (ref 135–145)
Total Bilirubin: 0.5 mg/dL (ref 0.3–1.2)
Total Protein: 5.9 g/dL — ABNORMAL LOW (ref 6.5–8.1)

## 2021-04-12 LAB — CBC
HCT: 35.4 % — ABNORMAL LOW (ref 36.0–46.0)
HCT: 25.9 % — ABNORMAL LOW (ref 36.0–46.0)
Hemoglobin: 11.2 g/dL — ABNORMAL LOW (ref 12.0–15.0)
Hemoglobin: 8.4 g/dL — ABNORMAL LOW (ref 12.0–15.0)
MCH: 25 pg — ABNORMAL LOW (ref 26.0–34.0)
MCH: 25.5 pg — ABNORMAL LOW (ref 26.0–34.0)
MCHC: 31.6 g/dL (ref 30.0–36.0)
MCHC: 32.4 g/dL (ref 30.0–36.0)
MCV: 79 fL — ABNORMAL LOW (ref 80.0–100.0)
MCV: 78.7 fL — ABNORMAL LOW (ref 80.0–100.0)
Platelets: 251 10*3/uL (ref 150–400)
Platelets: 233 10*3/uL (ref 150–400)
RBC: 4.48 MIL/uL (ref 3.87–5.11)
RBC: 3.29 MIL/uL — ABNORMAL LOW (ref 3.87–5.11)
RDW: 14.5 % (ref 11.5–15.5)
RDW: 14.6 % (ref 11.5–15.5)
WBC: 19 10*3/uL — ABNORMAL HIGH (ref 4.0–10.5)
WBC: 19.8 10*3/uL — ABNORMAL HIGH (ref 4.0–10.5)
nRBC: 0 % (ref 0.0–0.2)
nRBC: 0 % (ref 0.0–0.2)

## 2021-04-12 MED ORDER — MAGNESIUM SULFATE BOLUS VIA INFUSION
4.0000 g | Freq: Once | INTRAVENOUS | Status: AC
  Administered 2021-04-12: 09:00:00 4 g via INTRAVENOUS
  Filled 2021-04-12: qty 1000

## 2021-04-12 MED ORDER — KETOROLAC TROMETHAMINE 30 MG/ML IJ SOLN
30.0000 mg | Freq: Once | INTRAMUSCULAR | Status: AC
  Administered 2021-04-12: 16:00:00 30 mg via INTRAVENOUS
  Filled 2021-04-12: qty 1

## 2021-04-12 MED ORDER — ONDANSETRON HCL 4 MG/2ML IJ SOLN: 4.0000 mg | INTRAMUSCULAR | Status: DC | PRN

## 2021-04-12 MED ORDER — PRENATAL MULTIVITAMIN CH
1.0000 | ORAL_TABLET | Freq: Every day | ORAL | Status: DC
  Administered 2021-04-13 – 2021-04-14 (×2): 1 via ORAL
  Filled 2021-04-12 (×2): qty 1

## 2021-04-12 MED ORDER — LABETALOL HCL 5 MG/ML IV SOLN: 20.0000 mg | INTRAVENOUS | Status: DC | PRN

## 2021-04-12 MED ORDER — LABETALOL HCL 5 MG/ML IV SOLN: 40.0000 mg | INTRAVENOUS | Status: DC | PRN

## 2021-04-12 MED ORDER — LACTATED RINGERS IV BOLUS
500.0000 mL | Freq: Once | INTRAVENOUS | Status: AC
  Administered 2021-04-12: 16:00:00 500 mL via INTRAVENOUS

## 2021-04-12 MED ORDER — MISOPROSTOL 200 MCG PO TABS
ORAL_TABLET | ORAL | Status: AC
  Administered 2021-04-12: 15:00:00 1000 ug via RECTAL
  Filled 2021-04-12: qty 5

## 2021-04-12 MED ORDER — DIBUCAINE (PERIANAL) 1 % EX OINT: 1.0000 "application " | TOPICAL_OINTMENT | CUTANEOUS | Status: DC | PRN

## 2021-04-12 MED ORDER — BENZOCAINE-MENTHOL 20-0.5 % EX AERO
1.0000 "application " | INHALATION_SPRAY | CUTANEOUS | Status: DC | PRN
  Administered 2021-04-13: 1 via TOPICAL
  Filled 2021-04-12: qty 56

## 2021-04-12 MED ORDER — ONDANSETRON HCL 4 MG PO TABS: 4.0000 mg | ORAL_TABLET | ORAL | Status: DC | PRN

## 2021-04-12 MED ORDER — MISOPROSTOL 200 MCG PO TABS: 1000.0000 ug | ORAL_TABLET | Freq: Once | ORAL | Status: AC

## 2021-04-12 MED ORDER — DIPHENHYDRAMINE HCL 25 MG PO CAPS: 25.0000 mg | ORAL_CAPSULE | Freq: Four times a day (QID) | ORAL | Status: DC | PRN

## 2021-04-12 MED ORDER — ACETAMINOPHEN 325 MG PO TABS
650.0000 mg | ORAL_TABLET | ORAL | Status: DC | PRN
  Administered 2021-04-13 – 2021-04-14 (×4): 650 mg via ORAL
  Filled 2021-04-12 (×4): qty 2

## 2021-04-12 MED ORDER — METHYLERGONOVINE MALEATE 0.2 MG/ML IJ SOLN
INTRAMUSCULAR | Status: AC
  Administered 2021-04-12: 14:00:00 0.2 mg via INTRAMUSCULAR
  Filled 2021-04-12: qty 1

## 2021-04-12 MED ORDER — LABETALOL HCL 200 MG PO TABS: 200.0000 mg | ORAL_TABLET | Freq: Two times a day (BID) | ORAL | Status: DC

## 2021-04-12 MED ORDER — MAGNESIUM SULFATE 40 GM/1000ML IV SOLN: 2.0000 g/h | INTRAVENOUS | Status: AC

## 2021-04-12 MED ORDER — FENTANYL CITRATE (PF) 100 MCG/2ML IJ SOLN
INTRAMUSCULAR | Status: AC
  Filled 2021-04-12: qty 2

## 2021-04-12 MED ORDER — IBUPROFEN 600 MG PO TABS
600.0000 mg | ORAL_TABLET | Freq: Four times a day (QID) | ORAL | Status: DC
  Administered 2021-04-12 – 2021-04-14 (×8): 600 mg via ORAL
  Filled 2021-04-12 (×8): qty 1

## 2021-04-12 MED ORDER — SIMETHICONE 80 MG PO CHEW: 80.0000 mg | CHEWABLE_TABLET | ORAL | Status: DC | PRN

## 2021-04-12 MED ORDER — METHYLERGONOVINE MALEATE 0.2 MG/ML IJ SOLN: 0.2000 mg | Freq: Once | INTRAMUSCULAR | Status: AC

## 2021-04-12 MED ORDER — METHYLERGONOVINE MALEATE 0.2 MG/ML IJ SOLN
0.2000 mg | Freq: Once | INTRAMUSCULAR | Status: AC
  Administered 2021-04-12: 15:00:00 0.2 mg via INTRAMUSCULAR

## 2021-04-12 MED ORDER — HYDRALAZINE HCL 10 MG PO TABS
10.0000 mg | ORAL_TABLET | Freq: Four times a day (QID) | ORAL | Status: DC
  Filled 2021-04-12 (×2): qty 1

## 2021-04-12 MED ORDER — MAGNESIUM SULFATE 40 GM/1000ML IV SOLN
2.0000 g/h | INTRAVENOUS | Status: DC
  Administered 2021-04-12: 09:00:00 2 g/h via INTRAVENOUS
  Filled 2021-04-12: qty 1000

## 2021-04-12 MED ORDER — OXYCODONE-ACETAMINOPHEN 5-325 MG PO TABS
1.0000 | ORAL_TABLET | ORAL | Status: DC | PRN
  Administered 2021-04-12: 23:00:00 1 via ORAL
  Filled 2021-04-12: qty 1

## 2021-04-12 MED ORDER — METHYLERGONOVINE MALEATE 0.2 MG/ML IJ SOLN
INTRAMUSCULAR | Status: AC
  Filled 2021-04-12: qty 1

## 2021-04-12 MED ORDER — TETANUS-DIPHTH-ACELL PERTUSSIS 5-2.5-18.5 LF-MCG/0.5 IM SUSY: 0.5000 mL | PREFILLED_SYRINGE | Freq: Once | INTRAMUSCULAR | Status: DC

## 2021-04-12 MED ORDER — MEASLES, MUMPS & RUBELLA VAC IJ SOLR: 0.5000 mL | Freq: Once | INTRAMUSCULAR | Status: DC

## 2021-04-12 MED ORDER — LABETALOL HCL 5 MG/ML IV SOLN: 80.0000 mg | INTRAVENOUS | Status: DC | PRN

## 2021-04-12 MED ORDER — FENTANYL CITRATE (PF) 100 MCG/2ML IJ SOLN
100.0000 ug | Freq: Once | INTRAMUSCULAR | Status: AC
  Administered 2021-04-12: 14:00:00 100 ug via INTRAVENOUS

## 2021-04-12 MED ORDER — WITCH HAZEL-GLYCERIN EX PADS: 1.0000 "application " | MEDICATED_PAD | CUTANEOUS | Status: DC | PRN

## 2021-04-12 MED ORDER — COCONUT OIL OIL
1.0000 "application " | TOPICAL_OIL | Status: DC | PRN
  Administered 2021-04-13: 1 via TOPICAL

## 2021-04-12 MED ORDER — SENNOSIDES-DOCUSATE SODIUM 8.6-50 MG PO TABS
2.0000 | ORAL_TABLET | Freq: Every day | ORAL | Status: DC
  Administered 2021-04-13 – 2021-04-14 (×2): 2 via ORAL
  Filled 2021-04-12 (×2): qty 2

## 2021-04-12 NOTE — Lactation Note (Addendum)
This note was copied from a baby's chart. Lactation Consultation Note  Patient Name: Monica Burgess [id number] Date: 04/12/2021 Reason for consult: L&D Initial assessment;Difficult latch;Primapara;1st time breastfeeding;Other (Comment);Breastfeeding assistance (LC LD visit / less than 60 mins/ mom on MagSo4, and per LD RN not feeling well/ LC offered to assist to latch / mom receptive / baby took a few sucks / and few tugs and did not sustain latch. baby STS. Dad at bedside.) Age:56 hours  LC informed Mom in with OBGYN and RN. LC spoke to Diplomatic Services operational officer, RN to call when Mom is feeling better.   LC checked in with RN, Melissa Perralta to see if this was a good time to help with latching. At the time, Mom states she had a long day and will formula feed for now. She would like to see lactation in the morning.   Maternal Data    Feeding Mother's Current Feeding Choice: Breast Milk and Formula  LATCH Score Latch: Repeated attempts needed to sustain latch, nipple held in mouth throughout feeding, stimulation needed to elicit sucking reflex.  Audible Swallowing: None  Type of Nipple: Flat  Comfort (Breast/Nipple): Soft / non-tender  Hold (Positioning): Full assist, staff holds infant at breast  LATCH Score: 4   Lactation Tools Discussed/Used    Interventions Interventions: Breast feeding basics reviewed;Assisted with latch;Skin to skin;Reverse pressure;Breast compression;Education  Discharge    Consult Status Consult Status: Follow-up from L&D Date: 04/12/21 Follow-up type: In-patient    Monica Bohan  Burgess 04/12/2021, 3:46 PM

## 2021-04-12 NOTE — Anesthesia Postprocedure Evaluation (Signed)
Anesthesia Post Note  Patient: Monica Burgess  Procedure(s) Performed: AN AD HOC LABOR EPIDURAL     Patient location during evaluation: Mother Baby Anesthesia Type: Epidural Level of consciousness: awake and alert Pain management: pain level controlled Vital Signs Assessment: post-procedure vital signs reviewed and stable Respiratory status: spontaneous breathing, nonlabored ventilation and respiratory function stable Cardiovascular status: stable Postop Assessment: no headache, no backache and epidural receding Anesthetic complications: no   No notable events documented.  Last Vitals:  Vitals:   04/12/21 1408 04/12/21 1515  BP: 133/77 133/87  Pulse: (!) 103 (!) 105  Resp:    Temp:    SpO2:      Last Pain:  Vitals:   04/12/21 1344  TempSrc:   PainSc: 1    Pain Goal: Patients Stated Pain Goal: 0 (04/12/21 1230)                 Mauricia Area

## 2021-04-12 NOTE — Progress Notes (Signed)
S: Patient comfortable in bed with epidural and feeling intermittent pressure, otherwise doing well.   O: Vitals:   04/12/21 0201 04/12/21 0231 04/12/21 0301 04/12/21 0331  BP: 135/70 137/79 (!) 124/50 140/86  Pulse: 83 88 75 99  Resp: 16 16 16 16   Temp:   98.1 F (36.7 C)   TempSrc:   Oral   SpO2:      Weight:      Height:        FHT:  FHR: 135 bpm, variability: moderate,  accelerations:  Present,  decelerations:  Present early UC:   regular, every 2-4 minutes SVE:   Dilation: 8 Effacement (%): 90 Station: -1 Exam by:: [email or web address] Resident  A / P: 25 y.o. G1P0 37w4 Induction of labor due to preeclampsia,  progressing well on pitocin.  IUPC Pitocin augmentation  GBS positive  Fetal Wellbeing:  Category I Pain Control:  Epidural Anticipated MOD:  NSVD  30, SNM 04/12/2021, 3:00 AM

## 2021-04-12 NOTE — Progress Notes (Signed)
Post Partum Day 0 Subjective: Passing clots, has cramps  Objective: Blood pressure 133/87, pulse (!) 105, temperature 99 F (37.2 C), temperature source Axillary, resp. rate 16, height 5\' 2"  (1.575 m), weight 95.2 kg, last menstrual period 07/02/2020, SpO2 98 %, unknown if currently breastfeeding.  Physical Exam:  General: alert, cooperative, and no distress Lochia: clots in vagina and LUS Uterine Fundus: firm DVT Evaluation: No evidence of DVT seen on physical exam.  Recent Labs    04/11/21 1910 04/12/21 0831  HGB 10.5* 11.2*  HCT 33.5* 35.4*  Extra 900 ml in blood loss Jada device place and filled with 120 mg, attached to wall suction  Assessment/Plan: PP hemorrhage, Jada in place. CBC in 2-3 hr   LOS: 2 days   04/14/21 04/12/2021, 4:01 PM

## 2021-04-12 NOTE — Progress Notes (Signed)
Patient Vitals for the past 4 hrs:  BP Temp Temp src Pulse Resp  04/12/21 0601 (!) 150/71 -- -- 99 16  04/12/21 0531 (!) 155/77 -- -- 98 16  04/12/21 0501 (!) 144/72 -- -- 87 16  04/12/21 0431 (!) 149/87 -- -- 85 16  04/12/21 0401 138/84 -- -- 94 16  04/12/21 0331 140/86 -- -- 99 16  04/12/21 0301 (!) 124/50 98.1 F (36.7 C) Oral 75 16  04/12/21 0231 137/79 -- -- 88 16   C/O urge to push/pressure.  Cx C/C/+1.  Practice pushed a few times, not a lot of movement. Offered to labor down, pt prefers to push.  FHR Cat 1.  Pitocin at 30 mu/min, MVUs ~ 242m but ctx q 4 minutes.  Will keep pushing, increase pitocin to 32/34 to see if ctx will get closer.

## 2021-04-12 NOTE — Progress Notes (Signed)
Called to bedside due to significant clots upon arrival to [facility] postpartum. ~200 cc of clot/blood on pad upon arrival. Fundus firm and at the umbilicus. Bladder previously drained with foley catheter in place due to distension. Internal exam performed and lower uterine segment swept manually. ~600 cc of clot removed. Poor tone in lower uterine segment. Magnesium paused. Methergine IM and 1000 mcg of rectal Cytotec given. 500 cc bolus of LR also given. Will continue to monitor closely and reassess. Plan for CBC in 4 hours. Discussed with Dr. Debroah Loop who is in agreement with plan of care.  Evalina Field, MD

## 2021-04-12 NOTE — Lactation Note (Signed)
This note was copied from a baby's chart. Lactation Consultation Note  Patient Name: Girl Zafiro Routson [id number] Date: 04/12/2021 Reason for consult: L&D Initial assessment;Difficult latch;Primapara;1st time breastfeeding;Other (Comment);Breastfeeding assistance (LC LD visit / less than 60 mins/ mom on MagSo4, and per LD RN not feeling well/ LC offered to assist to latch / mom receptive / baby took a few sucks / and few tugs and did not sustain latch. baby STS. Dad at bedside.) Age: 26 mins PP     Maternal Data    Feeding Mother's Current Feeding Choice: Breast Milk and Formula  LATCH Score Latch: Repeated attempts needed to sustain latch, nipple held in mouth throughout feeding, stimulation needed to elicit sucking reflex.  Audible Swallowing: None  Type of Nipple: Flat  Comfort (Breast/Nipple): Soft / non-tender  Hold (Positioning): Full assist, staff holds infant at breast  LATCH Score: 4   Lactation Tools Discussed/Used    Interventions Interventions: Breast feeding basics reviewed;Assisted with latch;Skin to skin;Reverse pressure;Breast compression;Education  Discharge    Consult Status Consult Status: Follow-up from L&D Date: 04/12/21 Follow-up type: In-patient    Matilde Sprang Teya Otterson 04/12/2021, 12:35 PM

## 2021-04-12 NOTE — Discharge Summary (Signed)
Postpartum Discharge Summary  Date of Service updated     Patient Name: Monica Burgess DOB: [birth year] MRN: [id number]  Date of admission: 04/10/2021 Delivery date:04/12/2021  Delivering provider: Genia Del  Date of discharge: 04/14/2021  Admitting diagnosis: Preeclampsia [O14.90] Intrauterine pregnancy: [redacted]w[redacted]d    Secondary diagnosis:  Principal Problem:   Vaginal delivery Active Problems:   Preeclampsia   Rubella non-immune status, antepartum  Additional problems: Acute blood loss anemia (vaginal bleeding)   Discharge diagnosis: Term Pregnancy Delivered                                              Post partum procedures: IV iron Augmentation: AROM, Pitocin, Cytotec, and IP Foley Complications: None  Hospital course: Induction of Labor With Vaginal Delivery   25y.o. yo G1P0 at 333w4das admitted to the hospital 04/10/2021 for induction of labor.  Indication for induction:  Pre-eclampsia .  Patient started her induction with Cytotec and foley balloon placement followed by Pitocin and AROM until she progressed to complete. She was started on magnesium intrapartum due to severe range blood pressures and a headache, with improvement in her BP and symptoms. She had an uncomplicated vaginal delivery. Membrane Rupture Time/Date: 6:52 PM ,04/11/2021   Delivery Method:Vaginal, Spontaneous  Episiotomy: None  Lacerations:  None  Details of delivery can be found in separate delivery note.  She was continued on magnesium until she developed a delayed postpartum hemorrhage. She had miso, methrgine and pitocin but also had a [place] ultimately [facility] the [facility]oved and her bleeding was then normal. Her blood pressures were labile with many normal blood pressures and some mild range. Over the last 12 hours she developed persistent mild range blood pressures and was started on Procardia and Lasix for home. Patient is discharged home 04/14/21.  Newborn Data: Birth date:04/12/2021   Birth time:11:03 AM  Gender:Female  Living status:Living  Apgars:9 ,9  Weight:3540 g   Magnesium Sulfate received: Yes: Seizure prophylaxis BMZ received: No Rhophylac: N/A MMR: Offered postpartum  T-DaP: Offered postpartum  Flu: No Transfusion: No   Physical exam  Vitals:   04/13/21 1934 04/13/21 2336 04/14/21 0056 04/14/21 0458  BP: (!) 135/58 (!) 150/77 135/68 (!) 145/73  Pulse: (!) 104 89 98 (!) 103  Resp: _0 Temp:  97.9 F (36.6 C)  97.6 F (36.4 C)  TempSrc:  Oral  Oral  SpO2: 96% 96%  97%  Weight:      Height:       General: alert, cooperative, and no distress Lochia: appropriate Uterine Fundus: firm Incision: N/A DVT Evaluation: No evidence of DVT seen on physical exam. No cords or calf tenderness.  Labs: Lab Results  Component Value Date   WBC 19.8 (H) 04/12/2021   HGB 8.4 (L) 04/12/2021   HCT 25.9 (L) 04/12/2021   MCV 78.7 (L) 04/12/2021   PLT 233 04/12/2021   CMP Latest Ref Rng & Units 04/12/2021  Glucose 70 - 99 mg/dL 129(H)  BUN 6 - 20 mg/dL 5(L)  Creatinine 0.44 - 1.00 mg/dL 0.85  Sodium 135 - 145 mmol/L 132(L)  Potassium 3.5 - 5.1 mmol/L 3.5  Chloride 98 - 111 mmol/L 103  CO2 22 - 32 mmol/L 18(L)  Calcium 8.9 - 10.3 mg/dL 8.6(L)  Total Protein 6.5 - 8.1 g/dL 5.9(L)  Total Bilirubin  0.3 - 1.2 mg/dL 0.5  Alkaline Phos 38 - 126 U/L 137(H)  AST 15 - 41 U/L 20  ALT 0 - 44 U/L 12   Edinburgh Score: Edinburgh Postnatal Depression Scale Screening Tool 04/13/2021  I have been able to laugh and see the funny side of things. 0  I have looked forward with enjoyment to things. 0  I have blamed myself unnecessarily when things went wrong. 2  I have been anxious or worried for no good reason. 2  I have felt scared or panicky for no good reason. 1  Things have been getting on top of me. 3  I have been so unhappy that I have had difficulty sleeping. 1  I have felt sad or miserable. 1  I have been so unhappy that I have been crying. 1  The  thought of harming myself has occurred to me. 0  Edinburgh Postnatal Depression Scale Total 11     After visit meds:  Allergies as of 04/14/2021   No Known Allergies      Medication List     TAKE these medications    acetaminophen 500 MG tablet Commonly known as: TYLENOL Take 1,000 mg by mouth every 6 (six) hours as needed for mild pain or headache. What changed: Another medication with the same name was added. Make sure you understand how and when to take each.   acetaminophen 325 MG tablet Commonly known as: Tylenol Take 2 tablets (650 mg total) by mouth every 4 (four) hours as needed (for pain scale < 4). What changed: You were already taking a medication with the same name, and this prescription was added. Make sure you understand how and when to take each.   ferrous sulfate 325 (65 FE) MG EC tablet Take 325 mg by mouth daily.   furosemide 20 MG tablet Commonly known as: LASIX Take 1 tablet (20 mg total) by mouth daily. Start taking on: April 15, 2021   ibuprofen 600 MG tablet Commonly known as: ADVIL Take 1 tablet (600 mg total) by mouth every 6 (six) hours.   multivitamin-prenatal 27-0.8 MG Tabs tablet Take 1 tablet by mouth daily at 12 noon.   NIFEdipine 30 MG 24 hr tablet Commonly known as: ADALAT CC Take 1 tablet (30 mg total) by mouth daily. Start taking on: April 15, 2021   prenatal multivitamin Tabs tablet Take 1 tablet by mouth daily at 12 noon.         Discharge home in stable condition Infant Feeding: Bottle and Breast Infant Disposition:home with mother Discharge instruction: per After Visit Summary and Postpartum booklet. Activity: Advance as tolerated. Pelvic rest for 6 weeks.  Diet: routine diet Future Appointments:No future appointments. Follow up Visit:  [place] for [facility] at [facility] Follow up.   Specialty: Obstetrics and Gynecology Why: Come in for blood pressure  check tomorrow at 10:00 am Contact information: [place] [id number] [phone]               Patient to follow up with the [facility] for her postpartum visit. She will have a BP check at [facility] in one week. Message sent by Dr. Gwenlyn Perking on 04/12/21.  Additional Postpartum F/U: BP check 1 week  High risk pregnancy complicated by:  Severe pre-eclampsia Delivery mode:  Vaginal, Spontaneous  Anticipated Birth Control:  Unsure   04/14/2021 Radene Gunning, MD

## 2021-04-13 MED ORDER — NIFEDIPINE ER OSMOTIC RELEASE 30 MG PO TB24: 30.0000 mg | ORAL_TABLET | Freq: Every day | ORAL | Status: DC

## 2021-04-13 MED ORDER — SODIUM CHLORIDE 0.9 % IV SOLN
500.0000 mg | Freq: Once | INTRAVENOUS | Status: AC
  Administered 2021-04-13: 18:00:00 500 mg via INTRAVENOUS
  Filled 2021-04-13: qty 25

## 2021-04-13 MED ORDER — INFLUENZA VAC SPLIT QUAD 0.5 ML IM SUSY: 0.5000 mL | PREFILLED_SYRINGE | INTRAMUSCULAR | Status: DC

## 2021-04-13 NOTE — Progress Notes (Signed)
JADA removed. Pt had 2 small gushes of bleeding prior to removal. Fundus remained UE/Firm/Midline. Dr. Para March made aware. Upon removal of JADA, 5 quarter-sized clots were removed. Fundus was palpated and was still firm with no new bleeding noted afterwards. Will continue to monitor.

## 2021-04-13 NOTE — Lactation Note (Signed)
This note was copied from a baby's chart. Lactation Consultation Note  Patient Name: Monica Burgess [id number] Date: 04/13/2021 Reason for consult: Initial assessment;Primapara;1st time breastfeeding;Early term 37-38.6wks;Infant weight loss;Other (Comment);Difficult latch (3 % weight loss/ Difficult latch due to areola edema - semi compressible areolas. mom had a PPH after transfer to [facility], HgB today 8.4. per [facility] RN mom will be receiving Iron infusion today.) Age:77 hours LC set up the DEBP / per mom had been using the hand pump and it was making the nipple come out more.  LC assessed breast tissue while showing mom how to use the DEBP . ( See below ) Reviewed supply and demand / importance of consist pumping around the clock when the baby feeds. LC reassured mom the consistent pumping, shells, will improve the compressibility of the areolas.  LC called Salena Saner to assist mom with shells due to areola edema.   Maternal Data Has patient been taught Hand Expression?: Yes Does the patient have breastfeeding experience prior to this delivery?: No  Feeding Mother's Current Feeding Choice: Breast Milk and Formula Nipple Type: Slow - flow  LATCH Score                    Lactation Tools Discussed/Used Tools: Pump;Flanges (LC started mom with DEBP / mom had been using the hand pump.) Flange Size: 24;Other (comment) (#24 F was good fit to accommodate the base of the nipple to stretch the nipple / areola complex) Breast pump type: Manual;Double-Electric Breast Pump Pump Education: Setup, frequency, and cleaning;Milk Storage Reason for Pumping: due to Difficult latch, PPH >1000 ml EBL  Interventions Interventions: Breast feeding basics reviewed;Education;LC Services brochure;Hand pump;DEBP  Discharge    Consult Status Consult Status: Follow-up Date: 04/14/21 Follow-up type: In-patient    Monica Burgess 04/13/2021, 2:34 PM

## 2021-04-13 NOTE — Progress Notes (Signed)
Post Partum Day 1 Subjective: no complaints  Objective: Blood pressure (!) 119/53, pulse 85, temperature 98.1 F (36.7 C), resp. rate 20, height 5\' 2"  (1.575 m), weight 97.9 kg, last menstrual period 07/02/2020, SpO2 97 %, unknown if currently breastfeeding.  Physical Exam:  General: alert and no distress Lochia: appropriate Uterine Fundus: firm DVT Evaluation: No evidence of DVT seen on physical exam.  Recent Labs    04/12/21 0831 04/12/21 1821  HGB 11.2* 8.4*  HCT 35.4* 25.9*    Assessment/Plan: S/p PP hemorrhage, will remove Jada D/c  Mg after 24 hr   LOS: 3 days   04/14/21 04/13/2021, 7:13 AM

## 2021-04-13 NOTE — Progress Notes (Signed)
160 mL sterile fluid removed from JADA. Pt tolerated well. Will reassess in 30 minutes to see if JADA can be removed completely.

## 2021-04-14 MED ORDER — FUROSEMIDE 40 MG PO TABS: 20.0000 mg | ORAL_TABLET | Freq: Every day | ORAL | Status: DC

## 2021-04-14 MED ORDER — FUROSEMIDE 40 MG PO TABS: 20.0000 mg | ORAL_TABLET | Freq: Two times a day (BID) | ORAL | Status: DC

## 2021-04-14 MED ORDER — ACETAMINOPHEN 325 MG PO TABS: 650.0000 mg | ORAL_TABLET | ORAL | Status: DC | PRN

## 2021-04-14 MED ORDER — FUROSEMIDE 40 MG PO TABS
20.0000 mg | ORAL_TABLET | Freq: Every day | ORAL | Status: DC
  Administered 2021-04-14: 06:00:00 20 mg via ORAL
  Filled 2021-04-14: qty 1

## 2021-04-14 MED ORDER — FUROSEMIDE 20 MG PO TABS: 20.0000 mg | ORAL_TABLET | Freq: Every day | ORAL | 0 refills | Status: DC

## 2021-04-14 MED ORDER — NIFEDIPINE ER 30 MG PO TB24: 30.0000 mg | ORAL_TABLET | Freq: Every day | ORAL | 1 refills | Status: DC

## 2021-04-14 MED ORDER — NIFEDIPINE ER OSMOTIC RELEASE 30 MG PO TB24
30.0000 mg | ORAL_TABLET | Freq: Every day | ORAL | Status: DC
  Administered 2021-04-14: 06:00:00 30 mg via ORAL
  Filled 2021-04-14: qty 1

## 2021-04-14 MED ORDER — PRENATAL MULTIVITAMIN CH: 1.0000 | ORAL_TABLET | Freq: Every day | ORAL | 1 refills | Status: AC

## 2021-04-14 MED ORDER — NIFEDIPINE ER OSMOTIC RELEASE 30 MG PO TB24: 30.0000 mg | ORAL_TABLET | Freq: Every day | ORAL | Status: DC

## 2021-04-14 MED ORDER — IBUPROFEN 600 MG PO TABS: 600.0000 mg | ORAL_TABLET | Freq: Four times a day (QID) | ORAL | 0 refills | Status: DC

## 2021-04-14 MED ORDER — BUTALBITAL-APAP-CAFFEINE 50-325-40 MG PO TABS
1.0000 | ORAL_TABLET | Freq: Once | ORAL | Status: AC
  Administered 2021-04-14: 02:00:00 1 via ORAL
  Filled 2021-04-14: qty 1

## 2021-04-14 NOTE — Plan of Care (Signed)
Patient to be discharged home with printed instructions. Caffie Sotto L Dagmar Adcox, RN  

## 2021-04-14 NOTE — Lactation Note (Signed)
This note was copied from a baby's chart. Lactation Consultation Note  Patient Name: Monica Burgess [id number] Date: 04/14/2021 Reason for consult: Follow-up assessment;Primapara;1st time breastfeeding;Early term 37-38.6wks;Infant weight loss Age:26 hours LC reviewed BF D/C teaching - see below.  Grandmother and grandfather walked in . [facility] encouraged mom to call with feeding cues prior to feeding with bottle.   Maternal Data    Feeding Mother's Current Feeding Choice: Breast Milk and Formula Nipple Type: Slow - flow  LATCH Score                    Lactation Tools Discussed/Used    Interventions Interventions: Breast feeding basics reviewed;Education;DEBP;Hand pump;LC Services brochure  Discharge Pump: Manual (mom declined the [facility] loaner/ plans to use the hand pump until her WiC appt) WIC Program: Yes (per mom has appt on 12/28)  Consult Status Consult Status: Follow-up (encouraged to call with feeding cues prior to feeding cues) Date: 04/14/21 Follow-up type: In-patient    Monica Burgess 04/14/2021, 10:04 AM

## 2021-04-14 NOTE — Progress Notes (Signed)
CSW received consult due to score 11 on Edinburgh Depression Screen.    When CSW arrived, MOB was eating lunch, MGM was eating lunch, FOB as holding infant, and MGF was observing FOB and infant's interactions; everyone appeared happy and comfortable. CSW offered to return at a later time and MOB declined.  CSW explained CSW's role and MOB gave verbal consent to have all visitors to remain in the room while CSW complete clinical assessment.  The family appeared to be supportive of MOB.  FOB and MGM engaged with CSW. MOB was polite, easy to engage, and receptive to meeting with CSW.   CSW reviewed MOB's Inocente Salles results and asked about MOB's current emotions.  MOB reported; "I feel fine I'm just tired and looking forward to discharging so I can get some rest."  CSW provided education regarding Baby Blues vs PMADs and provided MOB with resources for mental health follow up.  CSW encouraged MOB to evaluate her mental health throughout the postpartum period with the use of the New Mom Checklist developed by [employer]partum [employer] as well as the [place] Postnatal Depression Scale and notify a medical professional if symptoms arise. MOB presented with insight and awareness and did not demonstrate any acute MH symptoms. Per MOB she has a good support team and expressed feeling comfortable seeking help if help is needed. CSW assessed for safety and MOB denied SI and HI. MOB declined resources however is aware resources can be provided at a later time by her OB provider if needed.   There are no barriers to discharge.   Blaine Hamper, MSW, LCSW Clinical Social Work [phone]

## 2021-04-14 NOTE — Progress Notes (Signed)
Pt declined the MMR vaccine. Toya Smothers, RN

## 2021-04-18 ENCOUNTER — Telehealth: Payer: Self-pay | Admitting: *Deleted

## 2021-04-18 ENCOUNTER — Ambulatory Visit: Payer: Medicaid Other

## 2021-04-18 NOTE — Telephone Encounter (Signed)
Called pt regarding missed appointment today for BP check.  She stated that she did not have a ride. Pt reports that she is taking her Nifedipine daily as prescribed. She denied H/A or visual disturbances. Appointment rescheduled for 1/3 @ 2:30 pm. Pt agreed and voiced understanding.

## 2021-04-22 ENCOUNTER — Inpatient Hospital Stay (HOSPITAL_COMMUNITY)
Admission: AD | Admit: 2021-04-22 | Discharge: 2021-04-23 | Disposition: A | Discharge status: Home / Self Care | Payer: Medicaid Other | Attending: Obstetrics and Gynecology | Admitting: Obstetrics and Gynecology

## 2021-04-22 ENCOUNTER — Inpatient Hospital Stay (HOSPITAL_COMMUNITY): Payer: Medicaid Other

## 2021-04-22 ENCOUNTER — Other Ambulatory Visit: Payer: Self-pay

## 2021-04-22 ENCOUNTER — Encounter (HOSPITAL_COMMUNITY): Payer: Self-pay

## 2021-04-22 DIAGNOSIS — R42 Dizziness and giddiness: Secondary | ICD-10-CM | POA: Diagnosis present

## 2021-04-22 DIAGNOSIS — R509 Fever, unspecified: Secondary | ICD-10-CM | POA: Diagnosis not present

## 2021-04-22 DIAGNOSIS — Z20822 Contact with and (suspected) exposure to covid-19: Secondary | ICD-10-CM | POA: Insufficient documentation

## 2021-04-22 DIAGNOSIS — G43809 Other migraine, not intractable, without status migrainosus: Secondary | ICD-10-CM

## 2021-04-22 DIAGNOSIS — Z79899 Other long term (current) drug therapy: Secondary | ICD-10-CM | POA: Insufficient documentation

## 2021-04-22 DIAGNOSIS — R519 Headache, unspecified: Secondary | ICD-10-CM | POA: Diagnosis present

## 2021-04-22 DIAGNOSIS — O9089 Other complications of the puerperium, not elsewhere classified: Secondary | ICD-10-CM | POA: Insufficient documentation

## 2021-04-22 DIAGNOSIS — G43909 Migraine, unspecified, not intractable, without status migrainosus: Secondary | ICD-10-CM | POA: Insufficient documentation

## 2021-04-22 DIAGNOSIS — O9122 Nonpurulent mastitis associated with the puerperium: Secondary | ICD-10-CM | POA: Diagnosis present

## 2021-04-22 DIAGNOSIS — N61 Mastitis without abscess: Secondary | ICD-10-CM | POA: Diagnosis not present

## 2021-04-22 LAB — COMPREHENSIVE METABOLIC PANEL
ALT: 23 U/L (ref 0–44)
AST: 16 U/L (ref 15–41)
Albumin: 3.5 g/dL (ref 3.5–5.0)
Alkaline Phosphatase: 105 U/L (ref 38–126)
Anion gap: 13 (ref 5–15)
BUN: 10 mg/dL (ref 6–20)
CO2: 21 mmol/L — ABNORMAL LOW (ref 22–32)
Calcium: 8.6 mg/dL — ABNORMAL LOW (ref 8.9–10.3)
Chloride: 102 mmol/L (ref 98–111)
Creatinine, Ser: 0.92 mg/dL (ref 0.44–1.00)
GFR, Estimated: >60 mL/min (ref >60)
Glucose, Bld: 102 mg/dL — ABNORMAL HIGH (ref 70–99)
Potassium: 3.4 mmol/L — ABNORMAL LOW (ref 3.5–5.1)
Sodium: 136 mmol/L (ref 135–145)
Total Bilirubin: 0.7 mg/dL (ref 0.3–1.2)
Total Protein: 7.2 g/dL (ref 6.5–8.1)

## 2021-04-22 LAB — URINALYSIS, ROUTINE W REFLEX MICROSCOPIC
Bilirubin Urine: NEGATIVE
Glucose, UA: NEGATIVE mg/dL
Ketones, ur: NEGATIVE mg/dL
Leukocytes,Ua: NEGATIVE
Nitrite: NEGATIVE
Protein, ur: NEGATIVE mg/dL
Specific Gravity, Urine: 1.02 (ref 1.005–1.030)
pH: 7 (ref 5.0–8.0)

## 2021-04-22 LAB — CBC WITH DIFFERENTIAL/PLATELET
Abs Immature Granulocytes: 0.37 10*3/uL — ABNORMAL HIGH (ref 0.00–0.07)
Basophils Absolute: 0.1 10*3/uL (ref 0.0–0.1)
Basophils Relative: 0 %
Eosinophils Absolute: 0.1 10*3/uL (ref 0.0–0.5)
Eosinophils Relative: 1 %
HCT: 27.5 % — ABNORMAL LOW (ref 36.0–46.0)
Hemoglobin: 8.5 g/dL — ABNORMAL LOW (ref 12.0–15.0)
Immature Granulocytes: 2 %
Lymphocytes Relative: 6 %
Lymphs Abs: 1.4 10*3/uL (ref 0.7–4.0)
MCH: 25.7 pg — ABNORMAL LOW (ref 26.0–34.0)
MCHC: 30.9 g/dL (ref 30.0–36.0)
MCV: 83.1 fL (ref 80.0–100.0)
Monocytes Absolute: 0.5 10*3/uL (ref 0.1–1.0)
Monocytes Relative: 2 %
Neutro Abs: 19.7 10*3/uL — ABNORMAL HIGH (ref 1.7–7.7)
Neutrophils Relative %: 89 %
Platelets: 601 10*3/uL — ABNORMAL HIGH (ref 150–400)
RBC: 3.31 MIL/uL — ABNORMAL LOW (ref 3.87–5.11)
RDW: 16.7 % — ABNORMAL HIGH (ref 11.5–15.5)
WBC: 22.2 10*3/uL — ABNORMAL HIGH (ref 4.0–10.5)
nRBC: 0.6 % — ABNORMAL HIGH (ref 0.0–0.2)

## 2021-04-22 LAB — URINALYSIS, MICROSCOPIC (REFLEX)

## 2021-04-22 LAB — RESP PANEL BY RT-PCR (FLU A&B, COVID) ARPGX2
Influenza A by PCR: NEGATIVE
Influenza B by PCR: NEGATIVE
SARS Coronavirus 2 by RT PCR: NEGATIVE

## 2021-04-22 MED ORDER — SODIUM CHLORIDE 0.9 % IV SOLN: Freq: Once | INTRAVENOUS | Status: AC

## 2021-04-22 MED ORDER — ACETAMINOPHEN 500 MG PO TABS
1000.0000 mg | ORAL_TABLET | Freq: Once | ORAL | Status: AC
  Administered 2021-04-22: 1000 mg via ORAL
  Filled 2021-04-22: qty 2

## 2021-04-22 MED ORDER — DIPHENHYDRAMINE HCL 50 MG/ML IJ SOLN
25.0000 mg | Freq: Once | INTRAMUSCULAR | Status: AC
  Administered 2021-04-22: 25 mg via INTRAVENOUS
  Filled 2021-04-22: qty 1

## 2021-04-22 MED ORDER — SODIUM CHLORIDE 0.9 % IV SOLN
2.0000 g | Freq: Once | INTRAVENOUS | Status: AC
  Administered 2021-04-22: 2 g via INTRAVENOUS
  Filled 2021-04-22: qty 20

## 2021-04-22 MED ORDER — CEPHALEXIN 500 MG PO CAPS: 500.0000 mg | ORAL_CAPSULE | Freq: Four times a day (QID) | ORAL | 0 refills | Status: AC

## 2021-04-22 MED ORDER — METOCLOPRAMIDE HCL 5 MG/ML IJ SOLN
5.0000 mg | Freq: Once | INTRAMUSCULAR | Status: AC
  Administered 2021-04-22: 5 mg via INTRAVENOUS
  Filled 2021-04-22: qty 2

## 2021-04-22 NOTE — MAU Provider Note (Addendum)
History     CSN: [id number]  Arrival date and time: 04/22/21 1849   Event Date/Time   First Provider Initiated Contact with Patient 04/22/21 1953      Chief Complaint  Patient presents with   Hypertension   Headache   [name] is a 26 y.o. G1P1001 at [facility] s/p vaginal delivery 04/12/2021 with preeclampsia who presents to MAU for a headache (occipital), dizziness, pressure behind her eyes, blurry vision, nausea, shakiness. Patient reports these symptoms started this morning as soon as she woke up. Patient reports she tried to sleep off the symptoms, but states this did not help. Patient reports she took 600mg  ibuprofen at 8AM and at [place], which did not help.  Patient reports she gets headaches in the back of her head like this since she was a child and reports the eye pressure, blurry vision and nausea come along with them. Patient reports she has not been diagnosed with migraines. Patient reports she usually does not take any medication for these headaches and that she just waits for them to go away, which can take about 12 hours. Patient rates HA as 8/10 at this time.  Patient denies sick contacts. Patient reports she has had water, cereal, yogurt, hamburger and fries, but has not eaten anything since 12PM due to nausea. Patient denies vomiting.  Pt denies VB, vaginal discharge/odor/itching. Pt denies vomiting, abdominal pain, constipation, diarrhea, or urinary problems. Pt denies fever, chills, fatigue, sweating or changes in appetite. Pt denies SOB or chest pain. Pt denies light-headedness, weakness.  Problems this pregnancy include: preeclampsia, PP hemorrhage. Allergies? NKDA Current medications/supplements? Procardia 30mg  daily (took this AM), ibuprofen, lasix Pregnant/postpartum/breastfeeding? breastfeeding Prenatal care provider? [facility], next appt not scheduled   OB History     Gravida  1   Para  1   Term  1   Preterm      AB      Living  1      SAB       IAB      Ectopic      Multiple  0   Live Births  1           Past Medical History:  Diagnosis Date   Asthma     History reviewed. No pertinent surgical history.  Family History  Problem Relation Age of Onset   Cancer Mother     Social History   Tobacco Use   Smoking status: Never  Vaping Use   Vaping Use: Never used  Substance Use Topics   Alcohol use: No   Drug use: No    Allergies: No Known Allergies  Medications Prior to Admission  Medication Sig Dispense Refill Last Dose   acetaminophen (TYLENOL) 325 MG tablet Take 2 tablets (650 mg total) by mouth every 4 (four) hours as needed (for pain scale < 4).      acetaminophen (TYLENOL) 500 MG tablet Take 1,000 mg by mouth every 6 (six) hours as needed for mild pain or headache.      ferrous sulfate 325 (65 FE) MG EC tablet Take 325 mg by mouth daily.      furosemide (LASIX) 20 MG tablet Take 1 tablet (20 mg total) by mouth daily. 5 tablet 0    ibuprofen (ADVIL) 600 MG tablet Take 1 tablet (600 mg total) by mouth every 6 (six) hours. 30 tablet 0    NIFEdipine (ADALAT CC) 30 MG 24 hr tablet Take 1 tablet (30 mg total) by  mouth daily. 30 tablet 1    Prenatal Vit-Fe Fumarate-FA (MULTIVITAMIN-PRENATAL) 27-0.8 MG TABS tablet Take 1 tablet by mouth daily at 12 noon.      Prenatal Vit-Fe Fumarate-FA (PRENATAL MULTIVITAMIN) TABS tablet Take 1 tablet by mouth daily at 12 noon. 90 tablet 1     Review of Systems  Constitutional:  Negative for chills, diaphoresis, fatigue and fever.  HENT:         Pressure behind eyes.  Eyes:  Positive for visual disturbance.  Respiratory:  Negative for shortness of breath.   Cardiovascular:  Negative for chest pain.  Gastrointestinal:  Positive for nausea. Negative for abdominal pain, constipation, diarrhea and vomiting.  Genitourinary:  Negative for dysuria, flank pain, frequency, pelvic pain, urgency, vaginal bleeding and vaginal discharge.  Neurological:  Positive for dizziness  and headaches. Negative for weakness and light-headedness.       Shakiness.   Physical Exam   Blood pressure 131/67, pulse (!) 128, temperature (!) 100.4 F (38 C), temperature source Oral, resp. rate 20, height 5\' 2"  (1.575 m), weight 84.8 kg, SpO2 99 %, unknown if currently breastfeeding.  Patient Vitals for the past 24 hrs:  BP Temp Temp src Pulse Resp SpO2 Height Weight  04/22/21 2055 -- -- -- -- -- 99 % -- --  04/22/21 2050 -- -- -- -- -- 99 % -- --  04/22/21 2045 -- -- -- -- -- 98 % -- --  04/22/21 2040 -- -- -- -- -- 96 % -- --  04/22/21 2035 -- -- -- -- -- 99 % -- --  04/22/21 2030 -- -- -- -- -- 98 % -- --  04/22/21 2025 -- -- -- -- -- 98 % -- --  04/22/21 2019 -- -- -- -- -- 98 % -- --  04/22/21 2015 -- -- -- -- -- 98 % -- --  04/22/21 2010 -- -- -- -- -- 98 % -- --  04/22/21 2005 -- -- -- -- -- 99 % -- --  04/22/21 2000 -- -- -- -- -- 98 % -- --  04/22/21 1955 -- -- -- -- -- 98 % -- --  04/22/21 1945 -- -- -- -- -- 97 % -- --  04/22/21 1943 131/67 (!) 100.4 F (38 C) Oral (!) 128 20 -- -- --  04/22/21 1913 -- -- -- (!) 127 -- -- -- --  04/22/21 1912 124/64 -- -- -- -- 99 % -- --  04/22/21 1911 -- (!) 102.6 F (39.2 C) -- -- 18 -- 5\' 2"  (1.575 m) 84.8 kg  04/22/21 1825 (!) 149/85 99.3 F (37.4 C) Oral (!) 101 18 96 % -- --   Physical Exam Vitals and nursing note reviewed. Exam conducted with a chaperone present.  Constitutional:      General: She is not in acute distress.    Appearance: Normal appearance. She is not ill-appearing, toxic-appearing or diaphoretic.  HENT:     Head: Normocephalic and atraumatic.     Right Ear: Tympanic membrane, ear canal and external ear normal.     Left Ear: Tympanic membrane, ear canal and external ear normal.     Mouth/Throat:     Mouth: Mucous membranes are moist.     Pharynx: Oropharynx is clear. No oropharyngeal exudate or posterior oropharyngeal erythema.  Eyes:     Pupils: Pupils are equal, round, and reactive to light.   Cardiovascular:     Rate and Rhythm: Regular rhythm. Tachycardia present.     Heart sounds: Normal  heart sounds.  Pulmonary:     Effort: Pulmonary effort is normal.     Breath sounds: Normal breath sounds.  Chest:  Breasts:    Right: Normal. No swelling, bleeding, inverted nipple, mass, nipple discharge, skin change or tenderness.     Left: Normal. No swelling, bleeding, inverted nipple, mass, nipple discharge, skin change or tenderness.  Abdominal:     General: There is no distension.     Palpations: Abdomen is soft. There is no mass.     Tenderness: There is no abdominal tenderness. There is no guarding or rebound.  Skin:    General: Skin is warm and dry.  Neurological:     General: No focal deficit present.     Mental Status: She is alert and oriented to person, place, and time.  Psychiatric:        Mood and Affect: Mood normal.        Behavior: Behavior normal.        Thought Content: Thought content normal.        Judgment: Judgment normal.   Results for orders placed or performed during the hospital encounter of 04/22/21 (from the past 24 hour(s))  Urinalysis, Routine w reflex microscopic Urine, Clean Catch     Status: Abnormal   Collection Time: 04/22/21  7:20 PM  Result Value Ref Range   Color, Urine YELLOW YELLOW   APPearance CLEAR CLEAR   Specific Gravity, Urine 1.020 1.005 - 1.030   pH 7.0 5.0 - 8.0   Glucose, UA NEGATIVE NEGATIVE mg/dL   Hgb urine dipstick LARGE (A) NEGATIVE   Bilirubin Urine NEGATIVE NEGATIVE   Ketones, ur NEGATIVE NEGATIVE mg/dL   Protein, ur NEGATIVE NEGATIVE mg/dL   Nitrite NEGATIVE NEGATIVE   Leukocytes,Ua NEGATIVE NEGATIVE  Urinalysis, Microscopic (reflex)     Status: Abnormal   Collection Time: 04/22/21  7:20 PM  Result Value Ref Range   RBC / HPF 6-10 0 - 5 RBC/hpf   WBC, UA 0-5 0 - 5 WBC/hpf   Bacteria, UA RARE (A) NONE SEEN   Squamous Epithelial / LPF 0-5 0 - 5   Mucus PRESENT   CBC with Differential/Platelet     Status:  Abnormal   Collection Time: 04/22/21  8:16 PM  Result Value Ref Range   WBC 22.2 (H) 4.0 - 10.5 K/uL   RBC 3.31 (L) 3.87 - 5.11 MIL/uL   Hemoglobin 8.5 (L) 12.0 - 15.0 g/dL   HCT 27.5 (L) 36.0 - 46.0 %   MCV 83.1 80.0 - 100.0 fL   MCH 25.7 (L) 26.0 - 34.0 pg   MCHC 30.9 30.0 - 36.0 g/dL   RDW 16.7 (H) 11.5 - 15.5 %   Platelets 601 (H) 150 - 400 K/uL   nRBC 0.6 (H) 0.0 - 0.2 %   Neutrophils Relative % 89 %   Neutro Abs 19.7 (H) 1.7 - 7.7 K/uL   Lymphocytes Relative 6 %   Lymphs Abs 1.4 0.7 - 4.0 K/uL   Monocytes Relative 2 %   Monocytes Absolute 0.5 0.1 - 1.0 K/uL   Eosinophils Relative 1 %   Eosinophils Absolute 0.1 0.0 - 0.5 K/uL   Basophils Relative 0 %   Basophils Absolute 0.1 0.0 - 0.1 K/uL   Immature Granulocytes 2 %   Abs Immature Granulocytes 0.37 (H) 0.00 - 0.07 K/uL  Comprehensive metabolic panel     Status: Abnormal   Collection Time: 04/22/21  8:16 PM  Result Value Ref Range   Sodium  136 135 - 145 mmol/L   Potassium 3.4 (L) 3.5 - 5.1 mmol/L   Chloride 102 98 - 111 mmol/L   CO2 21 (L) 22 - 32 mmol/L   Glucose, Bld 102 (H) 70 - 99 mg/dL   BUN 10 6 - 20 mg/dL   Creatinine, Ser 0.92 0.44 - 1.00 mg/dL   Calcium 8.6 (L) 8.9 - 10.3 mg/dL   Total Protein 7.2 6.5 - 8.1 g/dL   Albumin 3.5 3.5 - 5.0 g/dL   AST 16 15 - 41 U/L   ALT 23 0 - 44 U/L   Alkaline Phosphatase 105 38 - 126 U/L   Total Bilirubin 0.7 0.3 - 1.2 mg/dL   GFR, Estimated >60 >60 mL/min   Anion gap 13 5 - 15   DG Chest Port 1 View  Result Date: 04/22/2021 CLINICAL DATA:  Dizziness and headache. EXAM: PORTABLE CHEST 1 VIEW COMPARISON:  April 12, 2020 FINDINGS: The heart size and mediastinal contours are within normal limits. Both lungs are clear. The visualized skeletal structures are unremarkable. IMPRESSION: No active disease. Electronically Signed   By: Virgina Norfolk M.D.   On: 04/22/2021 21:10    MAU Course  Procedures  MDM -Subjective Sx: headache (8/10), pressure behind eyes, blurry  vision, nausea - all part of normal HA experience for patient. Also reporting dizziness, shakiness. -no dx migraines -Fever, tachycardia found incidentally in MAU -symptoms unrelieved by ibuprofen at home -no f/u appt scheduled with OB [facility]) -preeclampsia, PPH with this delivery -normal exam: ears, throat, heart, lungs, breasts, abdomen -1000mg  Tylenol given in MAU -UA: lg hgb/rare bacteria, sending urine for cultures -CBC: WBCs 22.2, hgb 8.5, platelets 601 -CMP: K 3.4 -COVID/Flu: pending at time of care transfer -EKG: sinus tachycardia, otherwise normal -Chest X-Ray: WNL -care transferred to [facility]. Jimmye Norman, CNM Clarisa Fling, NP  9:21 PM 04/22/2021  Orders Placed This Encounter  Procedures   Resp Panel by RT-PCR (Flu A&B, Covid) Nasopharyngeal Swab    Standing Status:   Standing    Number of Occurrences:   1   Culture, OB Urine    Standing Status:   Standing    Number of Occurrences:   1   DG Chest Port 1 View    Standing Status:   Standing    Number of Occurrences:   1    Order Specific Question:   Symptom/Reason for Exam    Answer:   Fever of unknown origin [id number]   Urinalysis, Routine w reflex microscopic Urine, Clean Catch    Standing Status:   Standing    Number of Occurrences:   1   CBC with Differential/Platelet    Standing Status:   Standing    Number of Occurrences:   1   Comprehensive metabolic panel    Standing Status:   Standing    Number of Occurrences:   1   Urinalysis, Microscopic (reflex)    Standing Status:   Standing    Number of Occurrences:   1   EKG 12-Lead    Standing Status:   Standing    Number of Occurrences:   1   Meds ordered this encounter  Medications   acetaminophen (TYLENOL) tablet 1,000 mg   Assumed care Vitals:   04/22/21 2120 04/22/21 2127  BP:    Pulse:    Resp:    Temp:  (!) 101.2 F (38.4 C)  SpO2: 94%    States headache not better.  Flexing neck makes occipital pain a  bit worse, no neck pain Tender over  breasts, L>R, some induration left outer breast Negative Homans sign Suspect headache is migraine and fever is related to probable engorgement/mastitis. Doubt endometritis, though this treatment would cover that   Will start IV, give migraine cocktail, and Rocephin dose   Consulted Dr Elly Modena who agrees.  Headache improved to a "4" after cocktail and IVF Rocephin given WIll discharge home with Rx for Keflex  Assessment and Plan  A:  Postpartum Day #11       Fever, improved       Mastitis       Migraine headache, improved  P:   Discharge home        Supportive care        Rx Keflex x 10 days        Has appt Wed for BP check in our [facility] clinic (got [facility])        Encouraged to return if she develops worsening of symptoms, increase in pain, fever, or other concerning symptoms.   Seabron Spates, CNM

## 2021-04-22 NOTE — ED Provider Notes (Signed)
Emergency Medicine Provider OB Triage Evaluation Note  Monica Burgess is a 26 y.o. female, G1P1001, at [redacted]w[redacted]d gestation who presents to the emergency department with complaints of headache, dizziness.  Patient was induced and had a baby on December 24 for preeclampsia.  She comes in today with severe headache, dizziness and hypertension  Review of  Systems  Positive: Headache Negative: Visual changes  Physical Exam  BP (!) 149/85 (BP Location: Right Arm)    Pulse (!) 101    Temp 99.3 F (37.4 C) (Oral)    Resp 18    LMP 07/02/2020    SpO2 96%  General: Awake, no distress  HEENT: Atraumatic  Resp: Normal effort  Cardiac: Normal rate Abd: Nondistended, nontender  MSK: Moves all extremities without difficulty Neuro: Speech clear  Medical Decision Making  Pt evaluated for pregnancy concern and is stable for transfer to MAU. Pt is in agreement with plan for transfer.  6:34 PM Discussed with MAU APP, Sam, who accepts patient in transfer.  Clinical Impression  No diagnosis found.     Arthor Captain, PA-C 04/22/21 1835    Glendora Score, MD 04/23/21 [phone]

## 2021-04-22 NOTE — ED Notes (Signed)
Patient going to MAU
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
## 2021-04-22 NOTE — ED Triage Notes (Addendum)
Patient complains of headache, difficulty walking and stating that she thinks her BP is up again. Patient just gave birth on 12/24. Alert and oriented. Also complains of fever for same

## 2021-04-22 NOTE — MAU Provider Note (Incomplete Revision)
History     CSN: [id number]  Arrival date and time: 04/22/21 1849   Event Date/Time   First Provider Initiated Contact with Patient 04/22/21 1953      Chief Complaint  Patient presents with   Hypertension   Headache   [name] is a 26 y.o. G1P1001 at [facility] s/p vaginal delivery 04/12/2021 with preeclampsia who presents to MAU for a headache (occipital), dizziness, pressure behind her eyes, blurry vision, nausea, shakiness. Patient reports these symptoms started this morning as soon as she woke up. Patient reports she tried to sleep off the symptoms, but states this did not help. Patient reports she took 600mg  ibuprofen at 8AM and at [place], which did not help.  Patient reports she gets headaches in the back of her head like this since she was a child and reports the eye pressure, blurry vision and nausea come along with them. Patient reports she has not been diagnosed with migraines. Patient reports she usually does not take any medication for these headaches and that she just waits for them to go away, which can take about 12 hours. Patient rates HA as 8/10 at this time.  Patient denies sick contacts. Patient reports she has had water, cereal, yogurt, hamburger and fries, but has not eaten anything since 12PM due to nausea. Patient denies vomiting.  Pt denies VB, vaginal discharge/odor/itching. Pt denies vomiting, abdominal pain, constipation, diarrhea, or urinary problems. Pt denies fever, chills, fatigue, sweating or changes in appetite. Pt denies SOB or chest pain. Pt denies light-headedness, weakness.  Problems this pregnancy include: preeclampsia, PP hemorrhage. Allergies? NKDA Current medications/supplements? Procardia 30mg  daily (took this AM), ibuprofen, lasix Pregnant/postpartum/breastfeeding? breastfeeding Prenatal care provider? [facility], next appt not scheduled   OB History     Gravida  1   Para  1   Term  1   Preterm      AB      Living  1      SAB       IAB      Ectopic      Multiple  0   Live Births  1           Past Medical History:  Diagnosis Date   Asthma     History reviewed. No pertinent surgical history.  Family History  Problem Relation Age of Onset   Cancer Mother     Social History   Tobacco Use   Smoking status: Never  Vaping Use   Vaping Use: Never used  Substance Use Topics   Alcohol use: No   Drug use: No    Allergies: No Known Allergies  Medications Prior to Admission  Medication Sig Dispense Refill Last Dose   acetaminophen (TYLENOL) 325 MG tablet Take 2 tablets (650 mg total) by mouth every 4 (four) hours as needed (for pain scale < 4).      acetaminophen (TYLENOL) 500 MG tablet Take 1,000 mg by mouth every 6 (six) hours as needed for mild pain or headache.      ferrous sulfate 325 (65 FE) MG EC tablet Take 325 mg by mouth daily.      furosemide (LASIX) 20 MG tablet Take 1 tablet (20 mg total) by mouth daily. 5 tablet 0    ibuprofen (ADVIL) 600 MG tablet Take 1 tablet (600 mg total) by mouth every 6 (six) hours. 30 tablet 0    NIFEdipine (ADALAT CC) 30 MG 24 hr tablet Take 1 tablet (30 mg total) by  mouth daily. 30 tablet 1    Prenatal Vit-Fe Fumarate-FA (MULTIVITAMIN-PRENATAL) 27-0.8 MG TABS tablet Take 1 tablet by mouth daily at 12 noon.      Prenatal Vit-Fe Fumarate-FA (PRENATAL MULTIVITAMIN) TABS tablet Take 1 tablet by mouth daily at 12 noon. 90 tablet 1     Review of Systems  Constitutional:  Negative for chills, diaphoresis, fatigue and fever.  HENT:         Pressure behind eyes.  Eyes:  Positive for visual disturbance.  Respiratory:  Negative for shortness of breath.   Cardiovascular:  Negative for chest pain.  Gastrointestinal:  Positive for nausea. Negative for abdominal pain, constipation, diarrhea and vomiting.  Genitourinary:  Negative for dysuria, flank pain, frequency, pelvic pain, urgency, vaginal bleeding and vaginal discharge.  Neurological:  Positive for dizziness  and headaches. Negative for weakness and light-headedness.       Shakiness.   Physical Exam   Blood pressure 131/67, pulse (!) 128, temperature (!) 100.4 F (38 C), temperature source Oral, resp. rate 20, height 5\' 2"  (1.575 m), weight 84.8 kg, SpO2 99 %, unknown if currently breastfeeding.  Patient Vitals for the past 24 hrs:  BP Temp Temp src Pulse Resp SpO2 Height Weight  04/22/21 2055 -- -- -- -- -- 99 % -- --  04/22/21 2050 -- -- -- -- -- 99 % -- --  04/22/21 2045 -- -- -- -- -- 98 % -- --  04/22/21 2040 -- -- -- -- -- 96 % -- --  04/22/21 2035 -- -- -- -- -- 99 % -- --  04/22/21 2030 -- -- -- -- -- 98 % -- --  04/22/21 2025 -- -- -- -- -- 98 % -- --  04/22/21 2019 -- -- -- -- -- 98 % -- --  04/22/21 2015 -- -- -- -- -- 98 % -- --  04/22/21 2010 -- -- -- -- -- 98 % -- --  04/22/21 2005 -- -- -- -- -- 99 % -- --  04/22/21 2000 -- -- -- -- -- 98 % -- --  04/22/21 1955 -- -- -- -- -- 98 % -- --  04/22/21 1945 -- -- -- -- -- 97 % -- --  04/22/21 1943 131/67 (!) 100.4 F (38 C) Oral (!) 128 20 -- -- --  04/22/21 1913 -- -- -- (!) 127 -- -- -- --  04/22/21 1912 124/64 -- -- -- -- 99 % -- --  04/22/21 1911 -- (!) 102.6 F (39.2 C) -- -- 18 -- 5\' 2"  (1.575 m) 84.8 kg  04/22/21 1825 (!) 149/85 99.3 F (37.4 C) Oral (!) 101 18 96 % -- --   Physical Exam Vitals and nursing note reviewed. Exam conducted with a chaperone present.  Constitutional:      General: She is not in acute distress.    Appearance: Normal appearance. She is not ill-appearing, toxic-appearing or diaphoretic.  HENT:     Head: Normocephalic and atraumatic.     Right Ear: Tympanic membrane, ear canal and external ear normal.     Left Ear: Tympanic membrane, ear canal and external ear normal.     Mouth/Throat:     Mouth: Mucous membranes are moist.     Pharynx: Oropharynx is clear. No oropharyngeal exudate or posterior oropharyngeal erythema.  Eyes:     Pupils: Pupils are equal, round, and reactive to light.   Cardiovascular:     Rate and Rhythm: Regular rhythm. Tachycardia present.     Heart sounds: Normal  heart sounds.  Pulmonary:     Effort: Pulmonary effort is normal.     Breath sounds: Normal breath sounds.  Chest:  Breasts:    Right: Normal. No swelling, bleeding, inverted nipple, mass, nipple discharge, skin change or tenderness.     Left: Normal. No swelling, bleeding, inverted nipple, mass, nipple discharge, skin change or tenderness.  Abdominal:     General: There is no distension.     Palpations: Abdomen is soft. There is no mass.     Tenderness: There is no abdominal tenderness. There is no guarding or rebound.  Skin:    General: Skin is warm and dry.  Neurological:     General: No focal deficit present.     Mental Status: She is alert and oriented to person, place, and time.  Psychiatric:        Mood and Affect: Mood normal.        Behavior: Behavior normal.        Thought Content: Thought content normal.        Judgment: Judgment normal.   Results for orders placed or performed during the hospital encounter of 04/22/21 (from the past 24 hour(s))  Urinalysis, Routine w reflex microscopic Urine, Clean Catch     Status: Abnormal   Collection Time: 04/22/21  7:20 PM  Result Value Ref Range   Color, Urine YELLOW YELLOW   APPearance CLEAR CLEAR   Specific Gravity, Urine 1.020 1.005 - 1.030   pH 7.0 5.0 - 8.0   Glucose, UA NEGATIVE NEGATIVE mg/dL   Hgb urine dipstick LARGE (A) NEGATIVE   Bilirubin Urine NEGATIVE NEGATIVE   Ketones, ur NEGATIVE NEGATIVE mg/dL   Protein, ur NEGATIVE NEGATIVE mg/dL   Nitrite NEGATIVE NEGATIVE   Leukocytes,Ua NEGATIVE NEGATIVE  Urinalysis, Microscopic (reflex)     Status: Abnormal   Collection Time: 04/22/21  7:20 PM  Result Value Ref Range   RBC / HPF 6-10 0 - 5 RBC/hpf   WBC, UA 0-5 0 - 5 WBC/hpf   Bacteria, UA RARE (A) NONE SEEN   Squamous Epithelial / LPF 0-5 0 - 5   Mucus PRESENT   CBC with Differential/Platelet     Status:  Abnormal   Collection Time: 04/22/21  8:16 PM  Result Value Ref Range   WBC 22.2 (H) 4.0 - 10.5 K/uL   RBC 3.31 (L) 3.87 - 5.11 MIL/uL   Hemoglobin 8.5 (L) 12.0 - 15.0 g/dL   HCT 27.5 (L) 36.0 - 46.0 %   MCV 83.1 80.0 - 100.0 fL   MCH 25.7 (L) 26.0 - 34.0 pg   MCHC 30.9 30.0 - 36.0 g/dL   RDW 16.7 (H) 11.5 - 15.5 %   Platelets 601 (H) 150 - 400 K/uL   nRBC 0.6 (H) 0.0 - 0.2 %   Neutrophils Relative % 89 %   Neutro Abs 19.7 (H) 1.7 - 7.7 K/uL   Lymphocytes Relative 6 %   Lymphs Abs 1.4 0.7 - 4.0 K/uL   Monocytes Relative 2 %   Monocytes Absolute 0.5 0.1 - 1.0 K/uL   Eosinophils Relative 1 %   Eosinophils Absolute 0.1 0.0 - 0.5 K/uL   Basophils Relative 0 %   Basophils Absolute 0.1 0.0 - 0.1 K/uL   Immature Granulocytes 2 %   Abs Immature Granulocytes 0.37 (H) 0.00 - 0.07 K/uL  Comprehensive metabolic panel     Status: Abnormal   Collection Time: 04/22/21  8:16 PM  Result Value Ref Range   Sodium  136 135 - 145 mmol/L   Potassium 3.4 (L) 3.5 - 5.1 mmol/L   Chloride 102 98 - 111 mmol/L   CO2 21 (L) 22 - 32 mmol/L   Glucose, Bld 102 (H) 70 - 99 mg/dL   BUN 10 6 - 20 mg/dL   Creatinine, Ser 0.92 0.44 - 1.00 mg/dL   Calcium 8.6 (L) 8.9 - 10.3 mg/dL   Total Protein 7.2 6.5 - 8.1 g/dL   Albumin 3.5 3.5 - 5.0 g/dL   AST 16 15 - 41 U/L   ALT 23 0 - 44 U/L   Alkaline Phosphatase 105 38 - 126 U/L   Total Bilirubin 0.7 0.3 - 1.2 mg/dL   GFR, Estimated >60 >60 mL/min   Anion gap 13 5 - 15   DG Chest Port 1 View  Result Date: 04/22/2021 CLINICAL DATA:  Dizziness and headache. EXAM: PORTABLE CHEST 1 VIEW COMPARISON:  April 12, 2020 FINDINGS: The heart size and mediastinal contours are within normal limits. Both lungs are clear. The visualized skeletal structures are unremarkable. IMPRESSION: No active disease. Electronically Signed   By: Virgina Norfolk M.D.   On: 04/22/2021 21:10    MAU Course  Procedures  MDM -Subjective Sx: headache (8/10), pressure behind eyes, blurry  vision, nausea - all part of normal HA experience for patient. Also reporting dizziness, shakiness. -no dx migraines -Fever, tachycardia found incidentally in MAU -symptoms unrelieved by ibuprofen at home -no f/u appt scheduled with OB [facility]) -preeclampsia, PPH with this delivery -normal exam: ears, throat, heart, lungs, breasts, abdomen -1000mg  Tylenol given in MAU -UA: lg hgb/rare bacteria, sending urine for cultures -CBC: WBCs 22.2, hgb 8.5, platelets 601 -CMP: K 3.4 -COVID/Flu: pending at time of care transfer -EKG: sinus tachycardia, otherwise normal -Chest X-Ray: WNL -care transferred to [facility]. Jimmye Norman, CNM Clarisa Fling, NP  9:21 PM 04/22/2021  Orders Placed This Encounter  Procedures   Resp Panel by RT-PCR (Flu A&B, Covid) Nasopharyngeal Swab    Standing Status:   Standing    Number of Occurrences:   1   Culture, OB Urine    Standing Status:   Standing    Number of Occurrences:   1   DG Chest Port 1 View    Standing Status:   Standing    Number of Occurrences:   1    Order Specific Question:   Symptom/Reason for Exam    Answer:   Fever of unknown origin [id number]   Urinalysis, Routine w reflex microscopic Urine, Clean Catch    Standing Status:   Standing    Number of Occurrences:   1   CBC with Differential/Platelet    Standing Status:   Standing    Number of Occurrences:   1   Comprehensive metabolic panel    Standing Status:   Standing    Number of Occurrences:   1   Urinalysis, Microscopic (reflex)    Standing Status:   Standing    Number of Occurrences:   1   EKG 12-Lead    Standing Status:   Standing    Number of Occurrences:   1   Meds ordered this encounter  Medications   acetaminophen (TYLENOL) tablet 1,000 mg   Assumed care Vitals:   04/22/21 2120 04/22/21 2127  BP:    Pulse:    Resp:    Temp:  (!) 101.2 F (38.4 C)  SpO2: 94%    States headache not better.  Flexing neck makes occipital pain a  bit worse, no neck pain Tender over  breasts, L>R, some induration left outer breast Negative Homans sign Suspect headache is migraine and fever is related to probable engorgement/mastitis. Doubt endometritis, though this treatment would cover that   Will start IV, give migraine cocktail, and Rocephin dose   Consulted Dr Elly Modena who agrees.  Headache improved to a "4" after cocktail and IVF Rocephin given WIll discharge home with Rx for Augmentin Assessment and Plan

## 2021-04-23 ENCOUNTER — Ambulatory Visit: Payer: Medicaid Other

## 2021-04-23 DIAGNOSIS — O9122 Nonpurulent mastitis associated with the puerperium: Secondary | ICD-10-CM | POA: Diagnosis present

## 2021-04-24 ENCOUNTER — Telehealth (HOSPITAL_COMMUNITY): Payer: Self-pay

## 2021-04-24 LAB — CULTURE, OB URINE: Culture: 30000 — AB

## 2021-04-24 NOTE — Telephone Encounter (Signed)
"  Doing a lot better now. I was at the hospital the other night. They treated me for infection and a migraine. They weren't sure where the infection was coming from so they gave me a broad antibiotic. I'm just catching up on sleep now. I'm still having the headache." RN reviewed preeclampsia and signs/symptoms. "I have headache and blurry vision." RN asks patient if she has been checking her BP at home at all? "I'm not checking my BP at home but at the hospital it was alittle high." RN told patient to call OB-GYN after our phone call and speak with them about complaints of headache and blurry vision. Patient states that she will call. "How long does the bleeding last?" RN reviewed normal lochia amounts, color, and duration of bleeding. RN also reviewed what to much bleeding looks like and when to call her OB-GYN. "The flow has been ok, some times its not much then more." RN told patient to make sure to keep an empty bladder as a full bladder can cause bleeding to be heavier. RN also told patient that sometimes our bleeding can be impacted by our activity level as well. Patient has no other questions or concerns about her healing.  "She's good, alittle fussy and gassy. She is eating well, having good diapers, and gaining weight. She sleeps in a crib or bassinet." RN reviewed ABC's of safe sleep with patient. Patient declines any questions or concerns about baby.  EPDS score is 5.  Marcelino Duster [facility] 04/24/2021,1537

## 2021-08-18 ENCOUNTER — Other Ambulatory Visit: Payer: Self-pay

## 2021-08-18 ENCOUNTER — Emergency Department (HOSPITAL_BASED_OUTPATIENT_CLINIC_OR_DEPARTMENT_OTHER): Payer: Medicaid Other

## 2021-08-18 ENCOUNTER — Encounter (HOSPITAL_BASED_OUTPATIENT_CLINIC_OR_DEPARTMENT_OTHER): Payer: Self-pay | Admitting: Emergency Medicine

## 2021-08-18 ENCOUNTER — Emergency Department (HOSPITAL_BASED_OUTPATIENT_CLINIC_OR_DEPARTMENT_OTHER)
Admission: EM | Admit: 2021-08-18 | Discharge: 2021-08-19 | Disposition: A | Discharge status: Home / Self Care | Payer: Medicaid Other | Attending: Emergency Medicine | Admitting: Emergency Medicine

## 2021-08-18 DIAGNOSIS — G43409 Hemiplegic migraine, not intractable, without status migrainosus: Secondary | ICD-10-CM | POA: Diagnosis not present

## 2021-08-18 DIAGNOSIS — J45909 Unspecified asthma, uncomplicated: Secondary | ICD-10-CM | POA: Diagnosis not present

## 2021-08-18 DIAGNOSIS — R519 Headache, unspecified: Secondary | ICD-10-CM | POA: Diagnosis present
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
## 2021-08-19 MED ORDER — SODIUM CHLORIDE 0.9 % IV BOLUS
1000.0000 mL | Freq: Once | INTRAVENOUS | Status: AC
  Administered 2021-08-19: 1000 mL via INTRAVENOUS

## 2021-08-19 MED ORDER — KETOROLAC TROMETHAMINE 15 MG/ML IJ SOLN
15.0000 mg | Freq: Once | INTRAMUSCULAR | Status: AC
  Administered 2021-08-19: 15 mg via INTRAVENOUS
  Filled 2021-08-19: qty 1

## 2021-08-19 MED ORDER — DEXAMETHASONE SODIUM PHOSPHATE 10 MG/ML IJ SOLN
10.0000 mg | Freq: Once | INTRAMUSCULAR | Status: AC
  Administered 2021-08-19: 10 mg via INTRAVENOUS
  Filled 2021-08-19: qty 1

## 2021-08-19 MED ORDER — METOCLOPRAMIDE HCL 5 MG/ML IJ SOLN
10.0000 mg | Freq: Once | INTRAMUSCULAR | Status: AC
  Administered 2021-08-19: 10 mg via INTRAVENOUS
  Filled 2021-08-19: qty 2
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
## 2022-07-07 ENCOUNTER — Other Ambulatory Visit (HOSPITAL_BASED_OUTPATIENT_CLINIC_OR_DEPARTMENT_OTHER): Payer: Self-pay

## 2022-07-07 ENCOUNTER — Other Ambulatory Visit: Payer: Self-pay

## 2022-07-07 ENCOUNTER — Encounter (HOSPITAL_BASED_OUTPATIENT_CLINIC_OR_DEPARTMENT_OTHER): Payer: Self-pay

## 2022-07-07 ENCOUNTER — Emergency Department (HOSPITAL_BASED_OUTPATIENT_CLINIC_OR_DEPARTMENT_OTHER)
Admission: EM | Admit: 2022-07-07 | Discharge: 2022-07-07 | Disposition: A | Discharge status: Home / Self Care | Payer: Medicaid Other | Attending: Emergency Medicine | Admitting: Emergency Medicine

## 2022-07-07 DIAGNOSIS — J45909 Unspecified asthma, uncomplicated: Secondary | ICD-10-CM | POA: Diagnosis not present

## 2022-07-07 DIAGNOSIS — Z760 Encounter for issue of repeat prescription: Secondary | ICD-10-CM | POA: Diagnosis not present

## 2022-07-07 DIAGNOSIS — R0602 Shortness of breath: Secondary | ICD-10-CM | POA: Diagnosis present

## 2022-07-07 MED ORDER — ALBUTEROL SULFATE HFA 108 (90 BASE) MCG/ACT IN AERS
1.0000 | INHALATION_SPRAY | RESPIRATORY_TRACT | Status: DC | PRN
  Administered 2022-07-07: 2 via RESPIRATORY_TRACT
  Filled 2022-07-07: qty 6.7

## 2022-07-07 MED ORDER — ALBUTEROL SULFATE HFA 108 (90 BASE) MCG/ACT IN AERS
1.0000 | INHALATION_SPRAY | Freq: Four times a day (QID) | RESPIRATORY_TRACT | 0 refills | Status: DC | PRN
  Filled 2022-07-07: qty 18, 25d supply, fill #0

## 2022-07-07 NOTE — Discharge Instructions (Addendum)
Note the workup today was overall reassuring.  As discussed, albuterol sent into the pharmacy.  Recommend follow-up with primary care provider for continued prescription of albuterol inhaler.  Please not hesitate to return to emergency department for worrisome signs and symptoms we discussed become apparent.

## 2022-07-07 NOTE — ED Triage Notes (Signed)
Patient here POV from Home.  Endorses running out of her Asthma Medication (Albuterol Inhaler) and seeks Refill for same. No Fever. Some Dry Cough.   NAD Noted during Triage. A&Ox4. GCS 15. Ambulatory.

## 2022-07-07 NOTE — ED Provider Notes (Signed)
[place] Provider Note   CSN: [id number] Arrival date & time: 07/07/22  1639     History  Chief Complaint  Patient presents with   Asthma    Monica Burgess is a 27 y.o. female.   Asthma   27 year old female presents emergency department with daughter.  Patient states that she has been without her albuterol inhaler for the past few days.  States that she has had some increasing shortness of breath/wheeze and is requesting medication refill.  Denies fever, chills, chest pain,, nausea, vomiting, urinary symptoms, change in bowel habits.  Past medical history significant for asthma,  Home Medications Prior to Admission medications   Medication Sig Start Date End Date Taking? Authorizing Provider  albuterol (VENTOLIN HFA) 108 (90 Base) MCG/ACT inhaler Inhale 1-2 puffs into the lungs every 6 (six) hours as needed for wheezing or shortness of breath. 07/07/22  Yes Dion Saucier A, PA  ferrous sulfate 325 (65 FE) MG EC tablet Take 325 mg by mouth daily.    [provider]  furosemide (LASIX) 20 MG tablet Take 1 tablet (20 mg total) by mouth daily. 04/15/21   Radene Gunning, MD  NIFEdipine (ADALAT CC) 30 MG 24 hr tablet Take 1 tablet (30 mg total) by mouth daily. 04/15/21   Radene Gunning, MD  Prenatal Vit-Fe Fumarate-FA (PRENATAL MULTIVITAMIN) TABS tablet Take 1 tablet by mouth daily at 12 noon. 04/14/21   Radene Gunning, MD      Allergies    Patient has no known allergies.    Review of Systems   Review of Systems  All other systems reviewed and are negative.   Physical Exam Updated Vital Signs BP 129/86 (BP Location: Right Arm)   Pulse 85   Temp 99 F (37.2 C) (Oral)   Resp 18   Ht 5\' 2"  (1.575 m)   Wt 90.7 kg   SpO2 97%   BMI 36.58 kg/m  Physical Exam Vitals and nursing note reviewed.  Constitutional:      General: She is not in acute distress.    Appearance: She is well-developed.  HENT:     Head:  Normocephalic and atraumatic.  Eyes:     Conjunctiva/sclera: Conjunctivae normal.  Cardiovascular:     Rate and Rhythm: Normal rate and regular rhythm.     Heart sounds: No murmur heard. Pulmonary:     Effort: Pulmonary effort is normal. No respiratory distress.     Breath sounds: Wheezing present.     Comments: Mild to moderate diffuse wheeze auscultated during respiratory exam bilaterally. Abdominal:     Palpations: Abdomen is soft.     Tenderness: There is no abdominal tenderness.  Musculoskeletal:        General: No swelling.     Cervical back: Neck supple.  Skin:    General: Skin is warm and dry.     Capillary Refill: Capillary refill takes less than 2 seconds.  Neurological:     Mental Status: She is alert.  Psychiatric:        Mood and Affect: Mood normal.     ED Results / Procedures / Treatments   Labs (all labs ordered are listed, but only abnormal results are displayed) Labs Reviewed - No data to display  EKG None  Radiology No results found.  Procedures Procedures    Medications Ordered in ED Medications  albuterol (VENTOLIN HFA) 108 (90 Base) MCG/ACT inhaler 1-2 puff (has no administration in time range)  ED Course/ Medical Decision Making/ A&P                             Medical Decision Making Risk Prescription drug management.   This patient presents to the ED for concern of wheeze/shortness of breath, this involves an extensive number of treatment options, and is a complaint that carries with it a high risk of complications and morbidity.  The differential diagnosis includes The causes for shortness of breath include but are not limited to Cardiac (AHF, pericardial effusion and tamponade, arrhythmias, ischemia, etc) Respiratory (COPD, asthma, pneumonia, pneumothorax, primary pulmonary hypertension, PE/VQ mismatch) Hematological (anemia)  Co morbidities that complicate the patient evaluation  See HPI   Additional history  obtained:  Additional history obtained from EMR External records from outside source obtained and reviewed including hospital records   Lab Tests:  N/a   Imaging Studies ordered:  N/a   Cardiac Monitoring: / EKG:  The patient was maintained on a cardiac monitor.  I personally viewed and interpreted the cardiac monitored which showed an underlying rhythm of: Sinus rhythm   Consultations Obtained:  N/a   Problem List / ED Course / Critical interventions / Medication management  Asthma/medication refill I ordered medication including albuterol   Reevaluation of the patient after these medicines showed that the patient improved I have reviewed the patients home medicines and have made adjustments as needed   Social Determinants of Health:  Denies tobacco, illicit drug use   Test / Admission - Considered:  Asthma/medication refill Vitals signs within normal range and stable throughout visit. Patient with symptoms most consistent with mild asthma exacerbation. Physical exam findings presented low suspicion for pneumonia, acute infiltrate/fluid consolidation on auscultatory exam of lung fields bilaterally. Shared decision-making conversation was had with patient regarding viral testing as well as chest x-ray/EKG but patient declined at this time due to preferred treatment via albuterol inhaler.  Also had conversation regarding oral prednisone of which patient declined at this time.  Patient's albuterol inhaler refilled on the emergency department as well as 1 sent to local pharmacy.  Patient overall well-appearing, afebrile in no acute distress.  Recommend follow-up with primary care for reassessment of symptoms and further management of asthma.    Treatment plan discussed at length with patient and she acknowledged understanding was agreeable to said plan. Worrisome signs and symptoms were discussed with the patient, and the patient acknowledged understanding to return to the ED  if noticed. Patient was stable upon discharge.          Final Clinical Impression(s) / ED Diagnoses Final diagnoses:  Asthma, unspecified asthma severity, unspecified whether complicated, unspecified whether persistent  Encounter for medication refill    Rx / DC Orders ED Discharge Orders          Ordered    albuterol (VENTOLIN HFA) 108 (90 Base) MCG/ACT inhaler  Every 6 hours PRN        07/07/22 1714              Wilnette Kales, [place] 07/07/22 1725    Hayden Rasmussen, MD 07/08/22 1224

## 2022-12-03 IMAGING — US US OB < 14 WEEKS - US OB TV
1 series · 15 of 28 positions shown · non-contrast
Comparison: None.

CLINICAL DATA: Abdominal pain. Eight weeks and 2 days pregnant by
last menstrual period.

EXAM:
OBSTETRIC <14 WK US AND TRANSVAGINAL OB US
TECHNIQUE: Both transabdominal and transvaginal ultrasound examinations were
performed for complete evaluation of the gestation as well as the
maternal uterus, adnexal regions, and pelvic cul-de-sac.
Transvaginal technique was performed to assess early pregnancy.

[Series 1: us ob < 14 weeks - us ob tv · 15 of 42 slices shown]
[im 1/42]
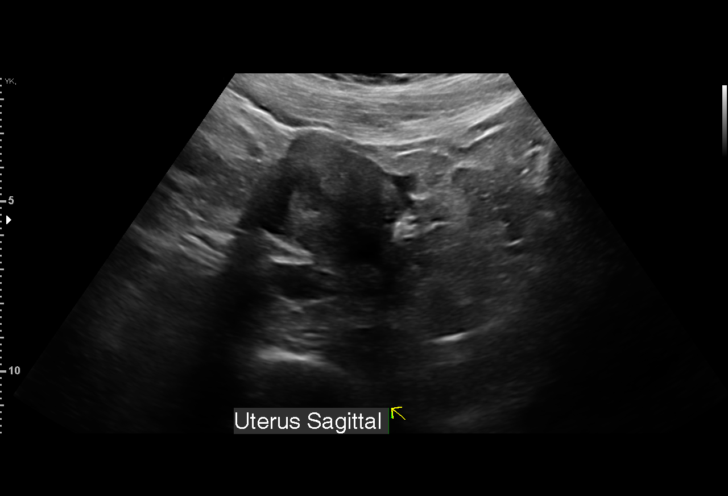
[im 4/42]
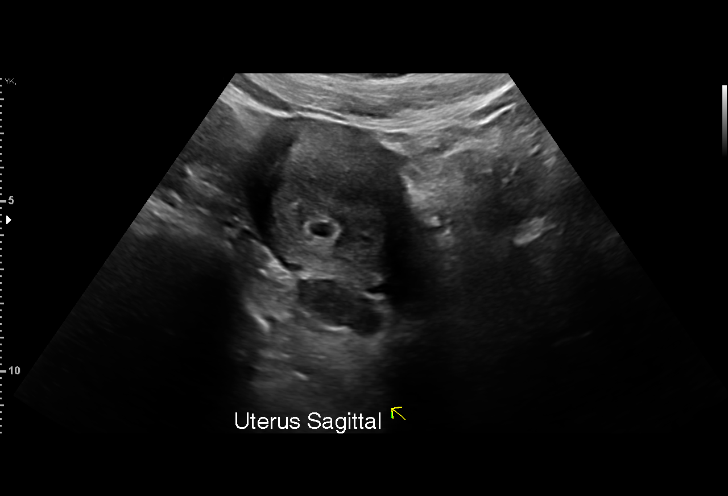
[im 7/42]
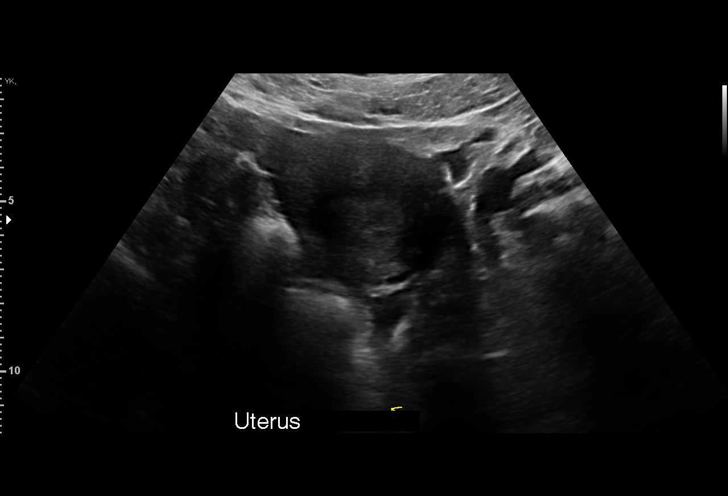
[im 10/42]
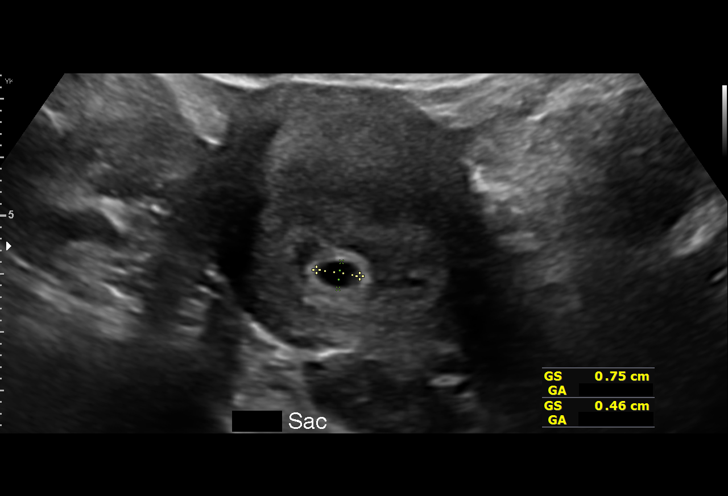
[im 13/42]
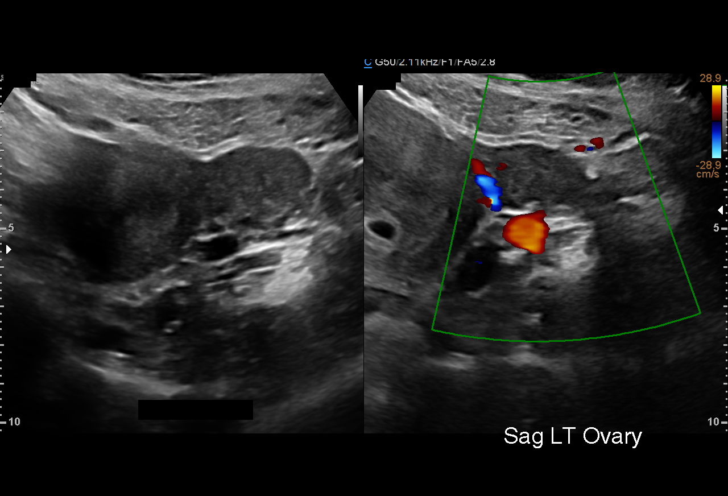
[im 16/42]
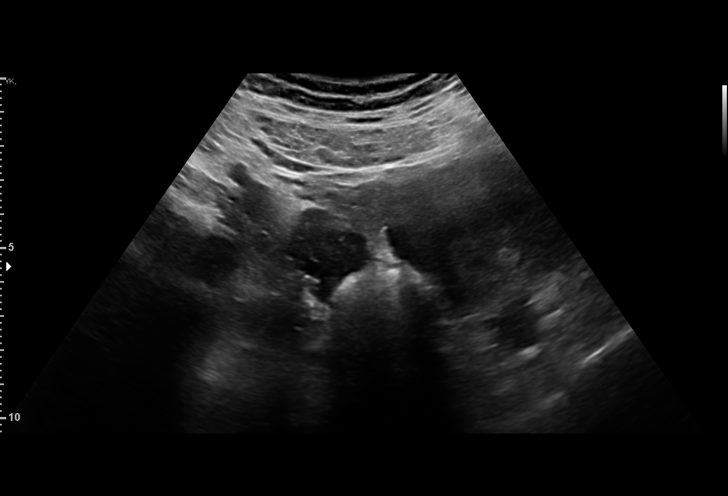
[im 19/42]
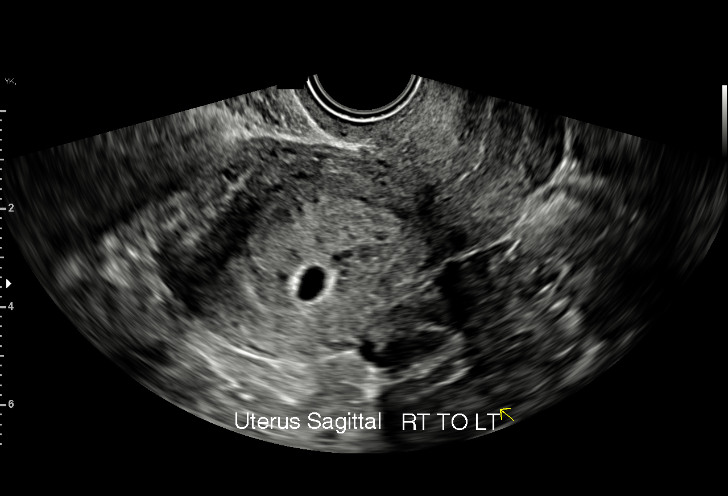
[im 22/42]
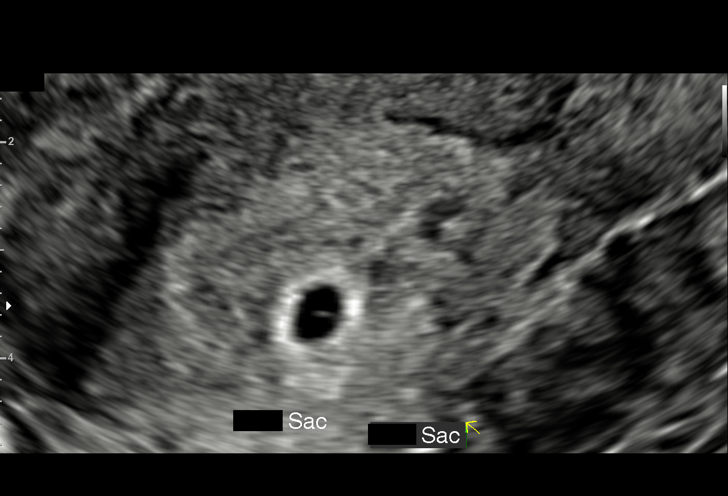
[im 23/42]
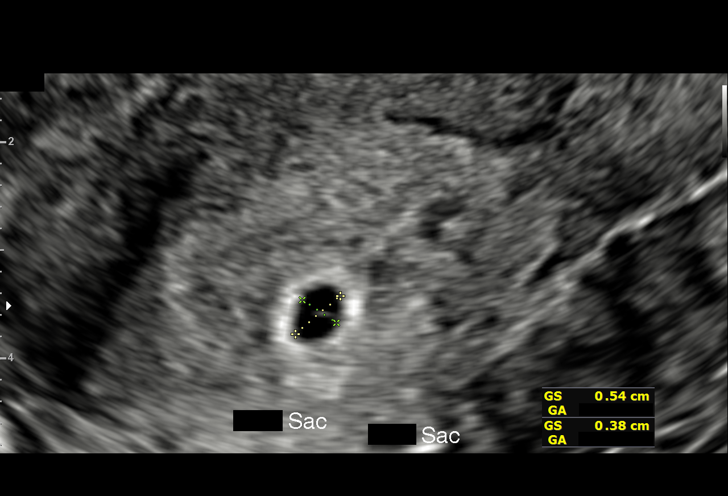
[im 26/42]
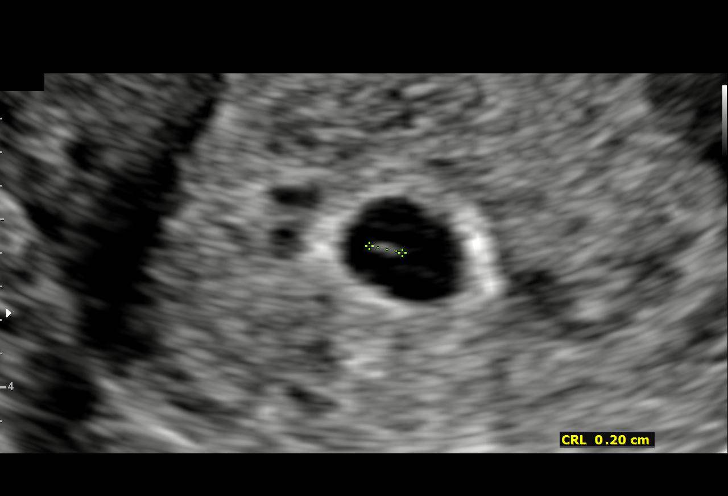
[im 29/42]
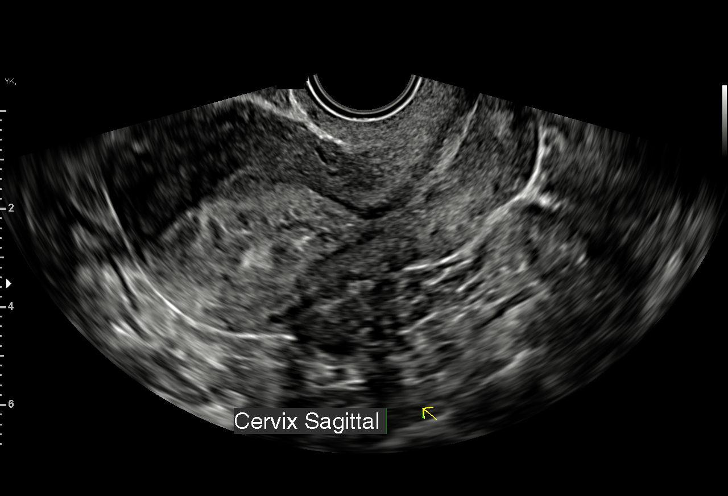
[im 32/42]
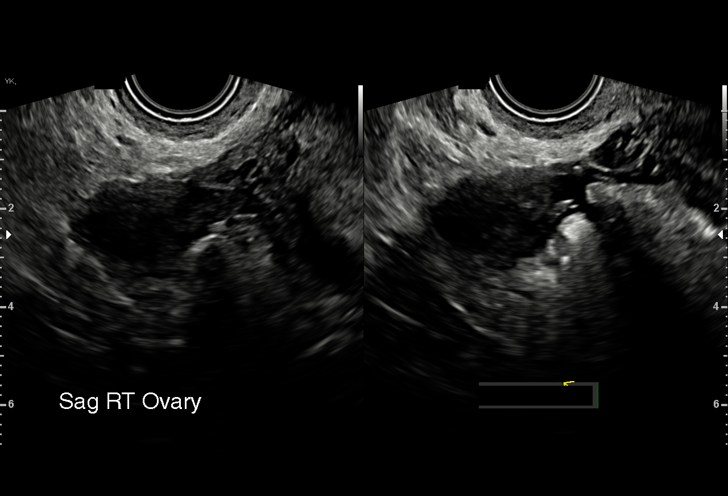
[im 35/42]
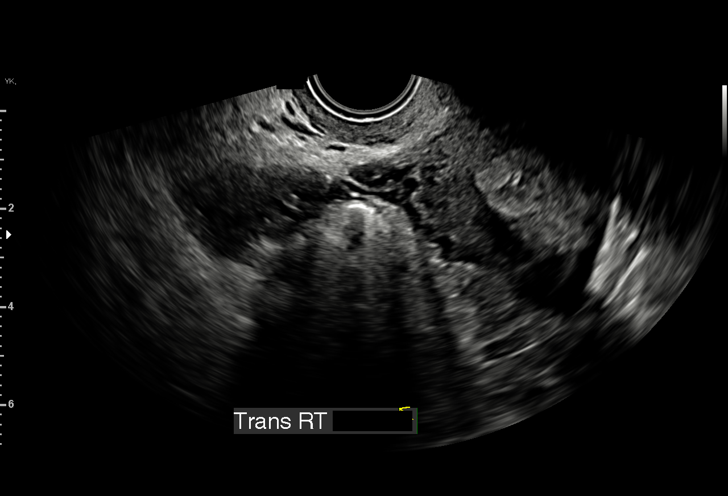
[im 38/42]
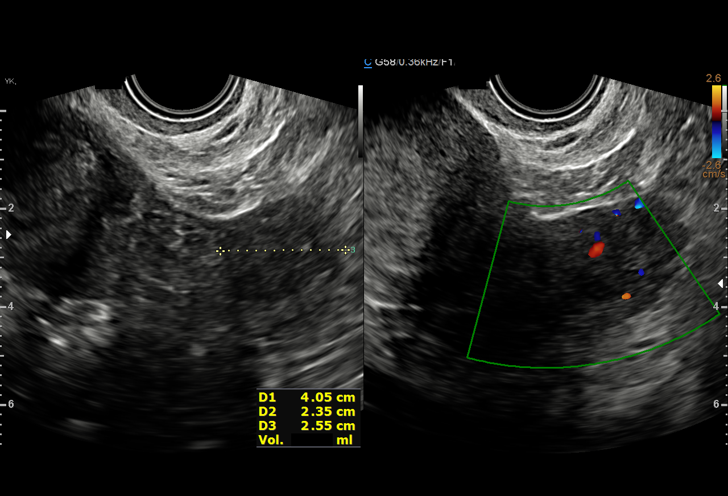
[im 42/42]
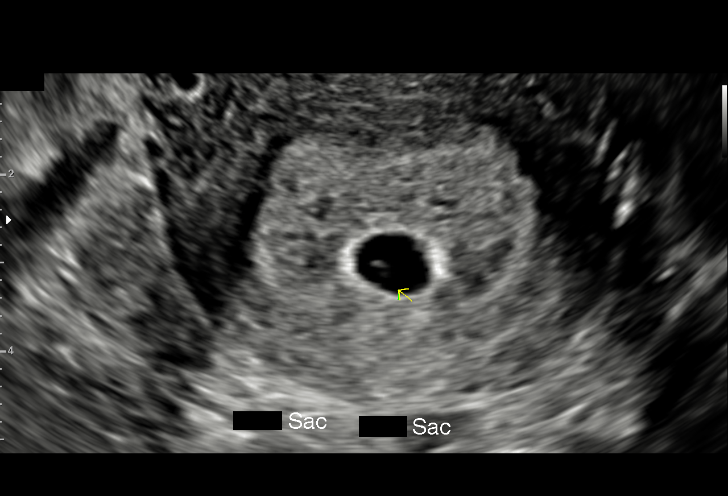

[15 of 28 positions shown; findings below may reference images not displayed]

FINDINGS: Intrauterine gestational sac: Visualized

Yolk sac:  Visualized

Embryo:  Visualized

Cardiac Activity: Not visualized

Heart Rate:   bpm

MSD: 6.1 mm   5 w   2 d

CRL:  1.9 mm. This is too small to estimate gestational age.

Subchorionic hemorrhage:  None visualized.

Maternal uterus/adnexae: Normal appearing maternal ovaries. No free
peritoneal fluid.
IMPRESSION: 1. Single intrauterine gestation with an estimated gestational age
of 5 weeks and 2 days.
2. No cardiac activity visible at this time. This may be normal due
to the very early in gestation. Recommend follow-up quantitative
B-HCG levels and follow-up US in 14 days to assess viability. This
recommendation follows SRU consensus guidelines: Diagnostic Criteria
for Nonviable Pregnancy Early in the First Trimester. N Engl J Med

## 2023-01-19 ENCOUNTER — Encounter (HOSPITAL_BASED_OUTPATIENT_CLINIC_OR_DEPARTMENT_OTHER): Payer: Self-pay

## 2023-01-19 ENCOUNTER — Emergency Department (HOSPITAL_BASED_OUTPATIENT_CLINIC_OR_DEPARTMENT_OTHER): Payer: Medicaid Other | Admitting: Radiology

## 2023-01-19 ENCOUNTER — Other Ambulatory Visit: Payer: Self-pay

## 2023-01-19 ENCOUNTER — Emergency Department (HOSPITAL_BASED_OUTPATIENT_CLINIC_OR_DEPARTMENT_OTHER)
Admission: EM | Admit: 2023-01-19 | Discharge: 2023-01-19 | Disposition: A | Discharge status: Home / Self Care | Payer: Medicaid Other | Attending: Emergency Medicine | Admitting: Emergency Medicine

## 2023-01-19 DIAGNOSIS — S93402A Sprain of unspecified ligament of left ankle, initial encounter: Secondary | ICD-10-CM | POA: Diagnosis not present

## 2023-01-19 DIAGNOSIS — M79672 Pain in left foot: Secondary | ICD-10-CM | POA: Diagnosis present

## 2023-01-19 DIAGNOSIS — X501XXA Overexertion from prolonged static or awkward postures, initial encounter: Secondary | ICD-10-CM | POA: Insufficient documentation

## 2023-01-19 LAB — PREGNANCY, URINE: Preg Test, Ur: NEGATIVE

## 2023-01-19 MED ORDER — KETOROLAC TROMETHAMINE 60 MG/2ML IM SOLN
30.0000 mg | Freq: Once | INTRAMUSCULAR | Status: AC
  Administered 2023-01-19: 30 mg via INTRAMUSCULAR
  Filled 2023-01-19: qty 2

## 2023-01-19 MED ORDER — OXYCODONE-ACETAMINOPHEN 5-325 MG PO TABS
1.0000 | ORAL_TABLET | ORAL | Status: DC | PRN
  Administered 2023-01-19: 1 via ORAL
  Filled 2023-01-19: qty 1
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
## 2023-01-19 NOTE — Discharge Instructions (Addendum)
Your x-ray imaging was negative for acute fracture or dislocation.  Placed you in a cam walker boot for likely ankle sprain.  Utilize crutches as needed for assistance with ambulation.  Tylenol and ibuprofen for pain control.  Recommend rest, ice, elevation of the extremity, NSAIDs for pain control, weightbearing as tolerated.

## 2023-01-25 ENCOUNTER — Encounter (HOSPITAL_COMMUNITY): Payer: Self-pay | Admitting: Registered Nurse

## 2023-01-25 ENCOUNTER — Ambulatory Visit (HOSPITAL_COMMUNITY)
Admission: EM | Admit: 2023-01-25 | Discharge: 2023-01-25 | Disposition: A | Discharge status: Home / Self Care | Payer: Medicaid Other | Attending: Registered Nurse | Admitting: Registered Nurse

## 2023-01-25 DIAGNOSIS — F4322 Adjustment disorder with anxiety: Secondary | ICD-10-CM | POA: Diagnosis present
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
## 2023-01-25 NOTE — ED Notes (Signed)
Patient dc by provider

## 2023-01-25 NOTE — Discharge Instructions (Addendum)
[facility]: Outpatient psychiatric Services:   Please see the walk in hours listed below.  Medication Management New Patient needing Medication Management Walk-in, and Existing Patients needing to see a provider for management coming as a walk in   Monday thru Friday 8:00 AM first come first serve until slots are full.  Recommend being there by 7:15 AM to ensure a slot is open.  Therapy New Patient Therapy Intake and Existing Patients needing to see therapist coming in as a walk in.   Monday, Wednesday, and Thursday morning at 8:00 am first come first serve.  Recommend being there by 7:15 AM to ensure a slot is open.    Every 1st, 2nd, and 3rd Friday at 1:00 PM first come first serve until slots are full.  Will still need to come in that morning at 7:15 AM to get registered for an afternoon slot.  For all walk-ins we ask that you arrive by 7:15 am because patients will be seen in there order of arrival (FIRST COME FIRST SERVE) Availability is limited, therefore you may not be seen on the same day that you walk in if all slots are full.    Our goal is to serve and meet the needs of our community to the best of our ability.      [facility] Phone: [phone] Physical Address:  [address] [place], [place]  [address]  Outpatient Services Life can be a challenge for [place] all. [facility]'s outpatient services offer a caring and experienced team of professionals who help people take the first step, which is often the most difficult. Together, we develop a well-defined and customized plan for each person that meets the individual's needs and goals. Each plan includes evidence-based practices as proven strategies that work. From board-certified psychiatrists, registered nurses, therapists, and outpatient office administrative professionals--all care and want to help you and your loved ones in every way  possible to ensure you succeed.  Open Access:   One way we ensure we get people the help they need when they request is is through Open Access. This service encourages individuals who are in dire need of our services and are new to [facility] to simply walk in or call us for virtual options, Monday through Friday between 8 a.m. and 3 p.m. On the same day of contact, if the individual has time to do so, he/she/they will complete patient registration and a comprehensive clinical assessment with a therapist. The assessment will provide treatment recommendations and the individual will leave with an appointment for the next service or a referral to the proper level of care.  While this process takes a few hours and is longer than a traditional appointment, it reduces what could otherwise be months of waiting for help or an appointment.   Telehealth Services:  [facility]'s telehealth services provide a safe, secure, and easy way to connect with a therapist or mental health provider for an individual or group therapy appointment. Click here to learn more about how [facility]'s telehealth services provide an important treatment option. These services may be accessed from the comfort of an individual's home, or at one of [facility]'s behavioral health offices such as this one where an individual may use on-site equipment for the visit.   Telehealth Services   A SAFE, SECURE, CONVENIENT TREATMENT OPTION:  [facility] telehealth services provide you with a safe, secure, and easy way to connect with your therapist or mental health provider  for an individual or group therapy appointment.  Using Psychologist, prison and probation services, telehealth appointments allow you to meet with [employer], therapists, nurse practitioners, and psychiatrists from your desktop or laptop computer, cell phone, or tablet device. Telehealth visits are compliant with all Health Insurance Portability and Accountability Act (HIPAA) requirements and you can  complete a telehealth visit from just about anywhere using internet or wi-fi access.  HOW DOES IT WORK?  [facility] uses the [employer] platform to host telehealth appointments. Prior to your scheduled visit, you will receive a direct link via text or email which will take you to your provider's online waiting room. Simply click that link at your appointment time and your provider will be notified that you've arrived. He or she will meet you online and you will complete your visit. Your provider may also have resources and information posted in his or her virtual waiting room which you may find helpful throughout your treatment.  In addition, you may receive a reminder telephone call from a [place] team member in the days leading up to your appointment. During that call, you will have an opportunity to provide important health information and medication updates which may save time during your scheduled appointment.     WHO USES TELEHEALTH SERVICES?  Telehealth services provide an alternative to in-person, face-to-face treatment for individuals receiving outpatient behavioral health services. At [facility], telehealth visits may also be used by individuals receiving Assertive Community Treatment (ACT) Team and Individual Placement and Support (IPS) services and other community-based, specialized services as needed. Telehealth services are also used for group therapy sessions, allowing people we support to connect during treatment with others who have similar experiences.

## 2023-02-22 ENCOUNTER — Emergency Department (HOSPITAL_BASED_OUTPATIENT_CLINIC_OR_DEPARTMENT_OTHER)
Admission: EM | Admit: 2023-02-22 | Discharge: 2023-02-22 | Disposition: A | Discharge status: Home / Self Care | Payer: Medicaid Other | Attending: Emergency Medicine | Admitting: Emergency Medicine

## 2023-02-22 ENCOUNTER — Encounter (HOSPITAL_BASED_OUTPATIENT_CLINIC_OR_DEPARTMENT_OTHER): Payer: Self-pay

## 2023-02-22 ENCOUNTER — Other Ambulatory Visit: Payer: Self-pay

## 2023-02-22 ENCOUNTER — Emergency Department (HOSPITAL_BASED_OUTPATIENT_CLINIC_OR_DEPARTMENT_OTHER): Payer: Medicaid Other

## 2023-02-22 DIAGNOSIS — Z7951 Long term (current) use of inhaled steroids: Secondary | ICD-10-CM | POA: Diagnosis not present

## 2023-02-22 DIAGNOSIS — J4521 Mild intermittent asthma with (acute) exacerbation: Secondary | ICD-10-CM | POA: Diagnosis not present

## 2023-02-22 DIAGNOSIS — R0602 Shortness of breath: Secondary | ICD-10-CM | POA: Diagnosis present

## 2023-02-22 DIAGNOSIS — L249 Irritant contact dermatitis, unspecified cause: Secondary | ICD-10-CM | POA: Diagnosis not present

## 2023-02-22 DIAGNOSIS — R238 Other skin changes: Secondary | ICD-10-CM

## 2023-02-22 MED ORDER — IPRATROPIUM-ALBUTEROL 0.5-2.5 (3) MG/3ML IN SOLN
3.0000 mL | Freq: Once | RESPIRATORY_TRACT | Status: AC
  Administered 2023-02-22: 3 mL via RESPIRATORY_TRACT

## 2023-02-22 MED ORDER — ALBUTEROL SULFATE HFA 108 (90 BASE) MCG/ACT IN AERS
INHALATION_SPRAY | RESPIRATORY_TRACT | Status: AC
  Administered 2023-02-22: 2 via RESPIRATORY_TRACT
  Filled 2023-02-22: qty 6.7

## 2023-02-22 MED ORDER — ALBUTEROL SULFATE (2.5 MG/3ML) 0.083% IN NEBU
INHALATION_SOLUTION | RESPIRATORY_TRACT | Status: AC
  Administered 2023-02-22: 2.5 mg via RESPIRATORY_TRACT
  Filled 2023-02-22: qty 3

## 2023-02-22 MED ORDER — ALBUTEROL SULFATE (2.5 MG/3ML) 0.083% IN NEBU: 2.5000 mg | INHALATION_SOLUTION | Freq: Four times a day (QID) | RESPIRATORY_TRACT | 12 refills | Status: AC | PRN

## 2023-02-22 MED ORDER — ALBUTEROL SULFATE (2.5 MG/3ML) 0.083% IN NEBU
5.0000 mg | INHALATION_SOLUTION | Freq: Once | RESPIRATORY_TRACT | Status: AC
  Administered 2023-02-22: 5 mg via RESPIRATORY_TRACT

## 2023-02-22 MED ORDER — IPRATROPIUM-ALBUTEROL 0.5-2.5 (3) MG/3ML IN SOLN
RESPIRATORY_TRACT | Status: AC
  Filled 2023-02-22: qty 3

## 2023-02-22 MED ORDER — ALBUTEROL SULFATE HFA 108 (90 BASE) MCG/ACT IN AERS: 2.0000 | INHALATION_SPRAY | RESPIRATORY_TRACT | Status: DC | PRN

## 2023-02-22 MED ORDER — DEXAMETHASONE 4 MG PO TABS
16.0000 mg | ORAL_TABLET | Freq: Once | ORAL | Status: AC
  Administered 2023-02-22: 16 mg via ORAL
  Filled 2023-02-22: qty 4

## 2023-02-22 MED ORDER — AEROCHAMBER PLUS FLO-VU LARGE MISC
1.0000 | Freq: Once | Status: AC
  Administered 2023-02-22: 1
  Filled 2023-02-22: qty 1

## 2023-02-22 MED ORDER — ALBUTEROL SULFATE (2.5 MG/3ML) 0.083% IN NEBU
INHALATION_SOLUTION | RESPIRATORY_TRACT | Status: AC
  Filled 2023-02-22: qty 6

## 2023-02-22 MED ORDER — ALBUTEROL SULFATE HFA 108 (90 BASE) MCG/ACT IN AERS: 1.0000 | INHALATION_SPRAY | Freq: Four times a day (QID) | RESPIRATORY_TRACT | 0 refills | Status: AC | PRN

## 2023-02-22 MED ORDER — ALBUTEROL SULFATE (2.5 MG/3ML) 0.083% IN NEBU: 2.5000 mg | INHALATION_SOLUTION | Freq: Once | RESPIRATORY_TRACT | Status: AC

## 2023-02-22 NOTE — Progress Notes (Signed)
RT evaluated the Pt in the lobby do to asthma being her only medical history. Pt was wheezing but not in distress. RT ordered a nebulizer and and albuterol inhaler. The Pt stated that she was out of her albuterol inhaler.

## 2023-02-22 NOTE — ED Notes (Signed)
ED Provider at bedside. 

## 2023-02-22 NOTE — ED Triage Notes (Signed)
Asthma exacerbation. Out of medications for a long time. Needs new PCP. RT at triage for treatment.   Also complains of foot sores on both feet since pedicure several weeks ago- dry flaky skin with multiple cracks in skin.

## 2023-02-22 NOTE — Discharge Instructions (Addendum)
You are seen in the emergency department today with concerns of shortness of breath.  You responded well to several different medications for her asthma.  I sent a prescription for albuterol to your pharmacy to continue use at home as needed.  If you have any worsening of symptoms, return to the emergency department.  I have given you info for a podiatrist locally. He works with [facility] and you may see anyone within that office.

## 2023-02-22 NOTE — ED Notes (Signed)
Discharge paperwork given and verbally understood. 

## 2023-02-23 NOTE — ED Provider Notes (Signed)
[place] EMERGENCY DEPARTMENT AT [facility] Provider Note   CSN: [id number] Arrival date & time: 02/22/23  1349     History Chief Complaint  Patient presents with   Shortness of Breath    asthma    Monica Burgess is a 27 y.o. female.  Patient with past history significant for asthma presents to the emergency department concerns of shortness of breath.  Has been out of her asthma medications for extended period of time now and had no way of recovering from shortness of breath at home.  Denies any recent fevers, cough, congestion, runny nose.  Also has some concerns about some sores/lacerations to bilateral feet.  States that these began after a recent pedicure.  These are painful to walk on but denies any recent bleeding or drainage coming from these areas.   Shortness of Breath      Home Medications Prior to Admission medications   Medication Sig Start Date End Date Taking? Authorizing Provider  albuterol (PROVENTIL) (2.5 MG/3ML) 0.083% nebulizer solution Take 3 mLs (2.5 mg total) by nebulization every 6 (six) hours as needed for wheezing or shortness of breath. 02/22/23  Yes Smitty Knudsen, PA-C  albuterol (VENTOLIN HFA) 108 (90 Base) MCG/ACT inhaler Inhale 1-2 puffs into the lungs every 6 (six) hours as needed for wheezing or shortness of breath. 02/22/23  Yes Smitty Knudsen, PA-C  ferrous sulfate 325 (65 FE) MG EC tablet Take 325 mg by mouth daily.    [provider]  furosemide (LASIX) 20 MG tablet Take 1 tablet (20 mg total) by mouth daily. 04/15/21   Milas Hock, MD  NIFEdipine (ADALAT CC) 30 MG 24 hr tablet Take 1 tablet (30 mg total) by mouth daily. 04/15/21   Milas Hock, MD  Prenatal Vit-Fe Fumarate-FA (PRENATAL MULTIVITAMIN) TABS tablet Take 1 tablet by mouth daily at 12 noon. 04/14/21   Milas Hock, MD      Allergies    Patient has no known allergies.    Review of Systems   Review of Systems  Respiratory:  Positive for shortness of breath.    All other systems reviewed and are negative.   Physical Exam Updated Vital Signs BP 133/80 (BP Location: Right Arm)   Pulse 86   Temp 97.8 F (36.6 C) (Oral)   Resp 16   Ht 5\' 2"  (1.575 m)   Wt 90.7 kg   SpO2 99%   BMI 36.58 kg/m  Physical Exam Vitals and nursing note reviewed.  Constitutional:      General: She is not in acute distress.    Appearance: She is well-developed.  HENT:     Head: Normocephalic and atraumatic.  Eyes:     Conjunctiva/sclera: Conjunctivae normal.  Cardiovascular:     Rate and Rhythm: Normal rate and regular rhythm.     Heart sounds: No murmur heard. Pulmonary:     Effort: Pulmonary effort is normal. No respiratory distress.     Breath sounds: Wheezing present. No decreased breath sounds, rhonchi or rales.  Abdominal:     Palpations: Abdomen is soft.     Tenderness: There is no abdominal tenderness.  Musculoskeletal:        General: No swelling.     Cervical back: Neck supple.  Skin:    General: Skin is warm and dry.     Capillary Refill: Capillary refill takes less than 2 seconds.  Neurological:     Mental Status: She is alert.  Psychiatric:  Mood and Affect: Mood normal.     ED Results / Procedures / Treatments   Labs (all labs ordered are listed, but only abnormal results are displayed) Labs Reviewed - No data to display  EKG None  Radiology DG Chest Portable 1 View  Result Date: 02/22/2023 CLINICAL DATA:  Shortness of breath.  History of asthma. EXAM: PORTABLE CHEST 1 VIEW COMPARISON:  04/22/2021 FINDINGS: The cardiomediastinal contours are normal. The lungs are clear. Pulmonary vasculature is normal. No consolidation, pleural effusion, or pneumothorax. No acute osseous abnormalities are seen. IMPRESSION: No active disease. Electronically Signed   By: Narda Rutherford M.D.   On: 02/22/2023 18:42    Procedures Procedures   Medications Ordered in ED Medications  albuterol (PROVENTIL) (2.5 MG/3ML) 0.083% nebulizer  solution 2.5 mg (2.5 mg Nebulization Given 02/22/23 1442)  ipratropium-albuterol (DUONEB) 0.5-2.5 (3) MG/3ML nebulizer solution 3 mL (3 mLs Nebulization Not Given 02/22/23 1441)  AeroChamber Plus Flo-Vu Large MISC 1 each (1 each Other Given 02/22/23 1442)  albuterol (PROVENTIL) (2.5 MG/3ML) 0.083% nebulizer solution 5 mg (5 mg/hr Nebulization Not Given 02/22/23 1537)  dexamethasone (DECADRON) tablet 16 mg (16 mg Oral Given 02/22/23 1617)    ED Course/ Medical Decision Making/ A&P                               Medical Decision Making Amount and/or Complexity of Data Reviewed Radiology: ordered.  Risk Prescription drug management.   This patient presents to the ED for concern of shortness of breath.  Differential diagnosis includes viral URI, asthma, pneumonia, PE   Imaging Studies ordered:  I ordered imaging studies including chest x-ray I independently visualized and interpreted imaging which showed no acute cardiopulmonary process. I agree with the radiologist interpretation   Medicines ordered and prescription drug management:  I ordered medication including albuterol, DuoNeb, Decadron for asthma exacerbation Reevaluation of the patient after these medicines showed that the patient improve would not d I have reviewed the patients home medicines and have made adjustments as needed   Problem List / ED Course:  Patient past history significant for asthma presents the emergency department concerns of shortness of breath.  Has been out of her asthma medications for some period of time and is here seeking treatment.  No signs of acute respiratory distress and patient was evaluated by respiratory therapist immediately.  Some notable wheezing initially seen and so patient initiated on albuterol and DuoNeb. On reassessment, patient does have some improvement in symptoms.  Added on Decadron.  Denies any recent cough, congestion, fevers, chills or bodyaches.  No recent exposure to any sick  contacts. On repeat reassessment, patient significantly improved after Decadron.  Minimal wheezing at this time.  Breathing no longer labored and no longer tachypneic.  From an asthma standpoint, patient stable for discharge home.  However patient did want to discuss the concerns about her feet.  I was able to look at these wounds/sores the patient has and these appear to be possible warts however patient reports that these come and go intermittently.  Could potentially be a dyshidrotic eczema type appearance but again not as likely given the area.  No obvious signs of infection present on these wounds as there is no evidence of erythema, induration, or drainage.  Unsure exactly what the cause of these foot wounds are however did encourage patient to follow-up with primary care provider or podiatrist for repeat assessment.  She has tried antifungal  medications, emollient cream, and topical steroids without improvement.  Again unsure at this time what these are although they do appear somewhat similar to warts. Patient has remained stable while here in the emergency department after multiple rounds of breathing treatments for her asthma exacerbation.  Given the patient is now stable, anticipate that she is stable for discharge home with outpatient follow-up.  A prescription for albuterol as well as nebulized albuterol solution sent to patient's pharmacy.  Encourage patient return to the emergency department if she has any acute worsening or decline in symptoms.  Patient discharged home in stable condition.  Final Clinical Impression(s) / ED Diagnoses Final diagnoses:  Mild intermittent asthma with exacerbation  Skin irritation    Rx / DC Orders ED Discharge Orders          Ordered    albuterol (VENTOLIN HFA) 108 (90 Base) MCG/ACT inhaler  Every 6 hours PRN        02/22/23 1728    albuterol (PROVENTIL) (2.5 MG/3ML) 0.083% nebulizer solution  Every 6 hours PRN        02/22/23 1728               Smitty Knudsen, PA-C 02/23/23 0027    Royanne Foots, DO 03/02/23 1142

## 2023-03-02 ENCOUNTER — Ambulatory Visit (HOSPITAL_COMMUNITY): Payer: Medicaid Other | Admitting: Student

## 2023-03-04 ENCOUNTER — Ambulatory Visit (HOSPITAL_COMMUNITY): Payer: Medicaid Other | Admitting: Student

## 2023-11-23 IMAGING — CT CT HEAD W/O CM
4 series · 16 of 47 positions shown, 18 images · non-contrast
Comparison: None.

CLINICAL DATA: Severe headache.



[Series 2: head bone · axial · 0.41mm/px · z∈[+777,+809]mm · 3 of 79 slices shown]
[im 8/79  bone]
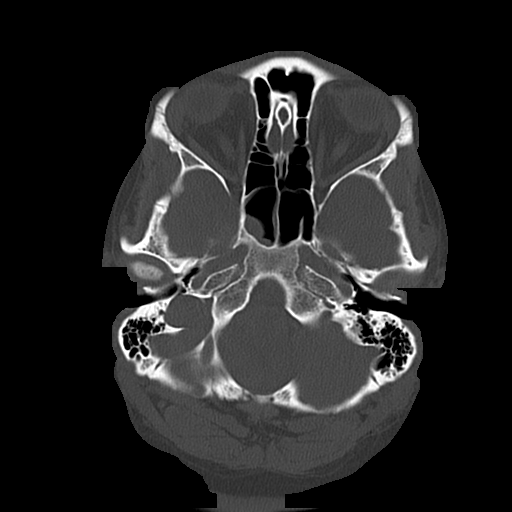
[im 16/79  bone]
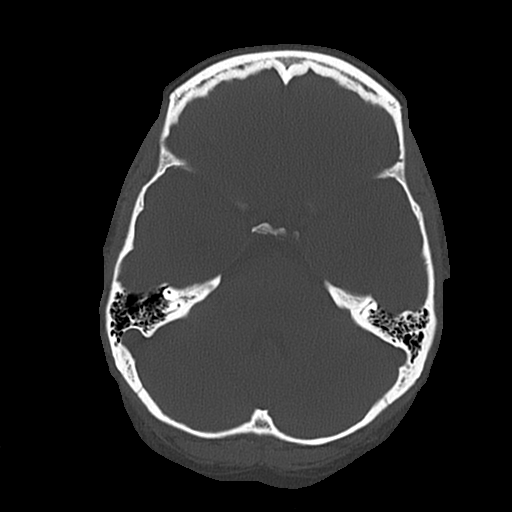
[im 24/79  bone]
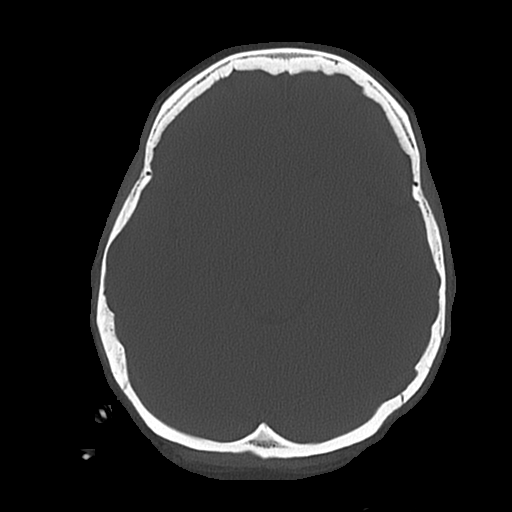

[Series 3: head wo · axial · 0.41mm/px · z∈[+778,+898]mm · 7 of 32 slices shown, 9 images]
[im 4/32  brain]
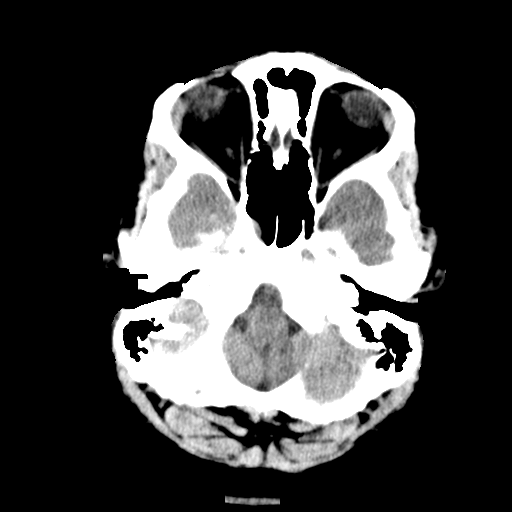
[im 4/32  bone]
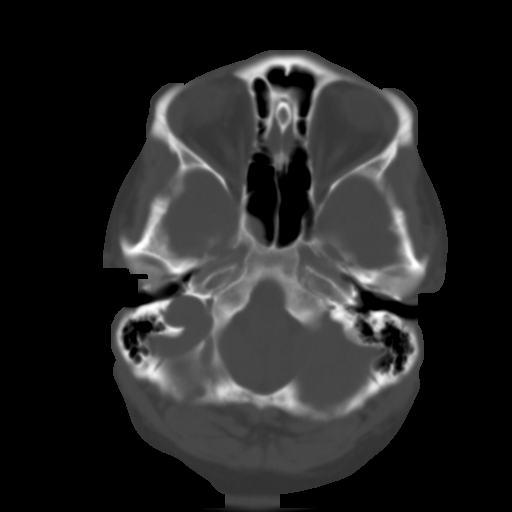
[im 8/32  brain]
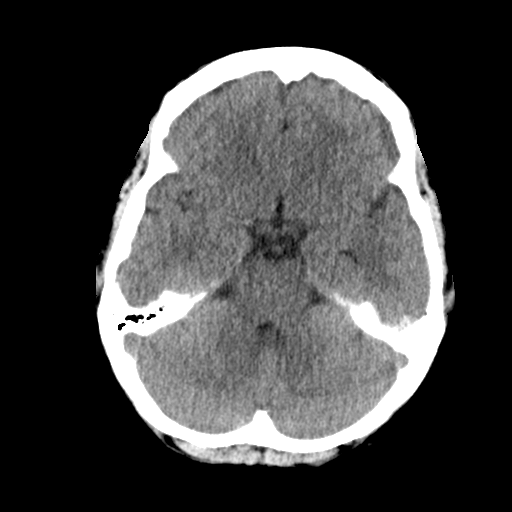
[im 12/32  brain]
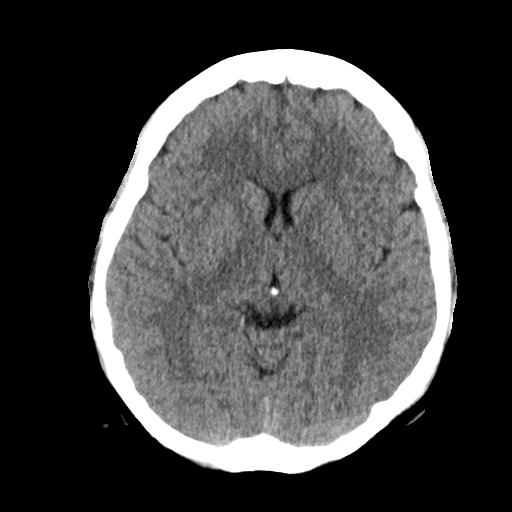
[im 16/32  brain]
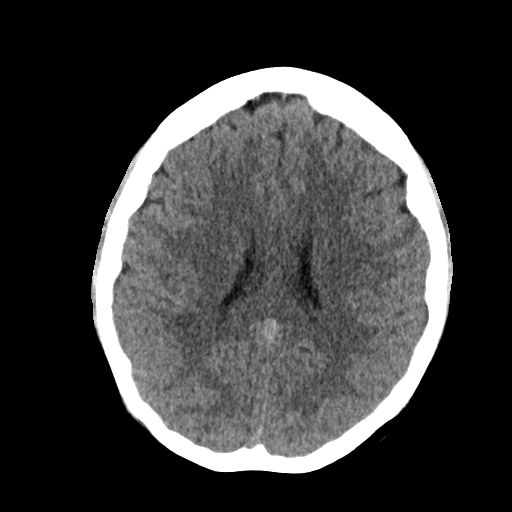
[im 20/32  brain]
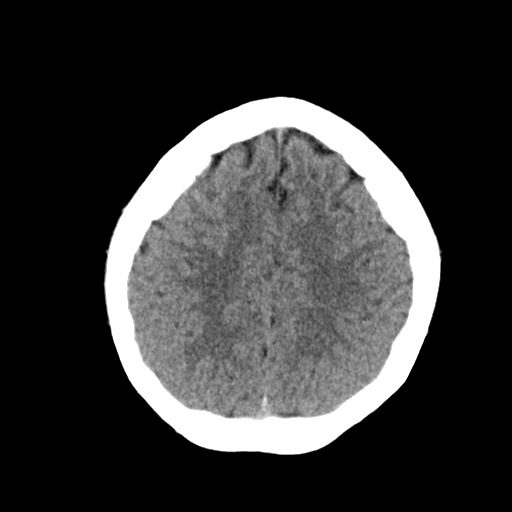
[im 20/32  bone]
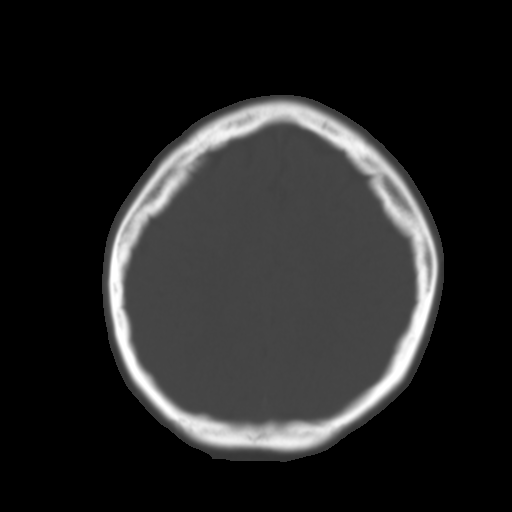
[im 24/32  brain]
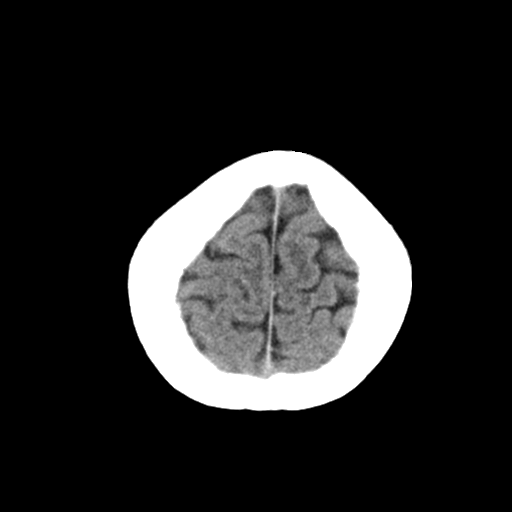
[im 28/32  brain]
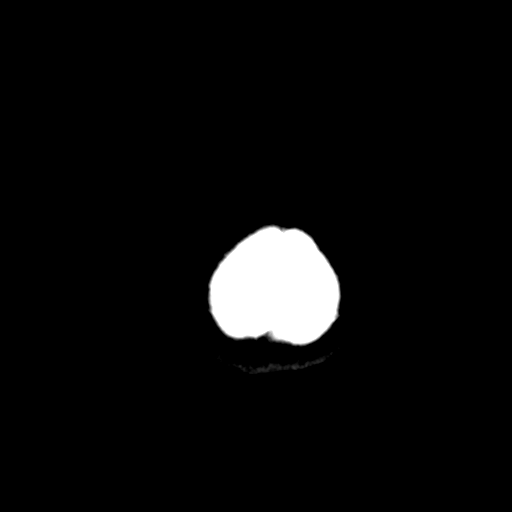

[Series 4: coronal soft · coronal · 0.29mm/px · 3 of 63 slices shown]
[im 21/63  brain]
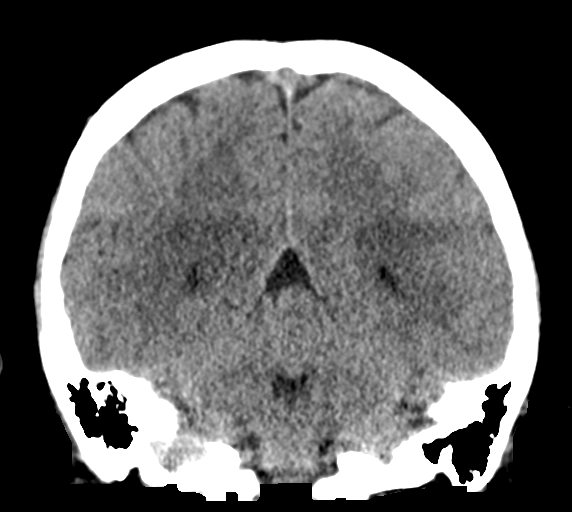
[im 28/63  brain]
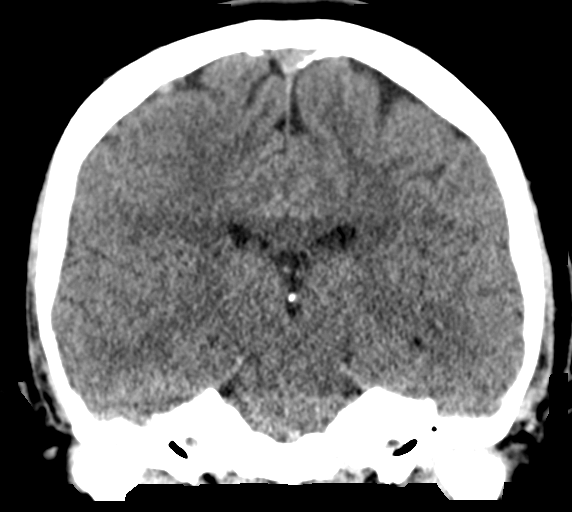
[im 35/63  brain]
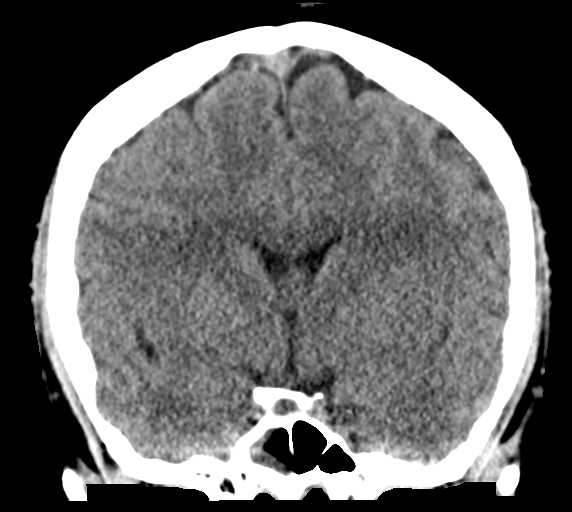

[Series 5: sagittal soft · sagittal · 0.29mm/px · 3 of 55 slices shown]
[im 19/55  brain]
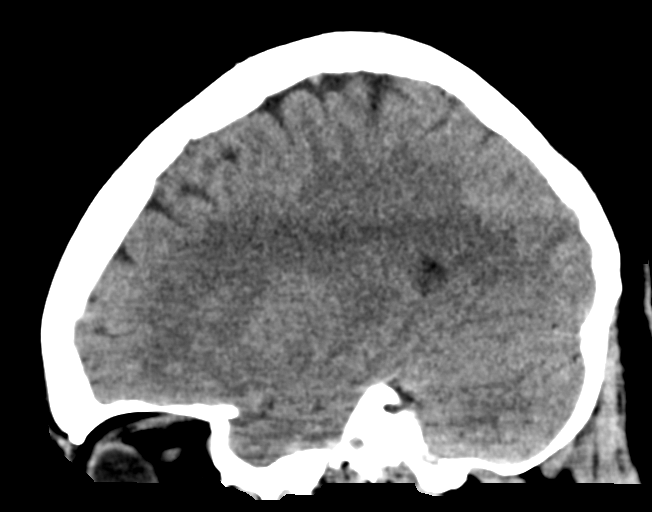
[im 28/55  brain]
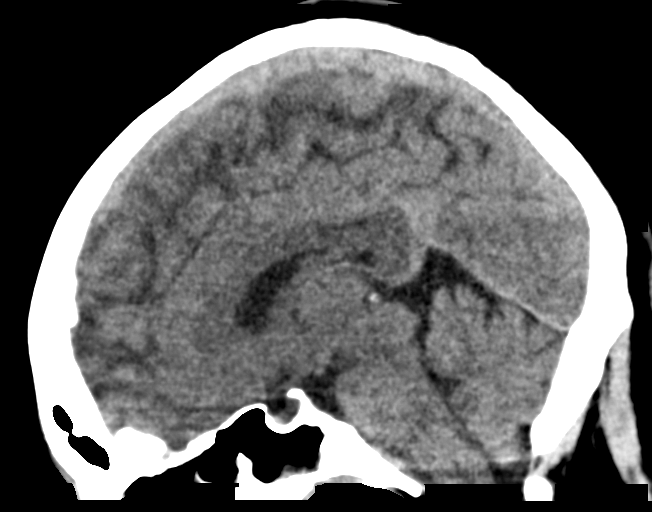
[im 37/55  brain]
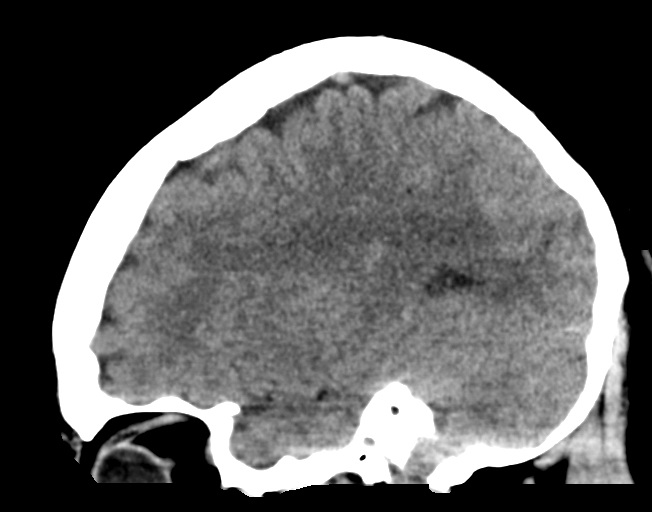

[16 of 47 positions shown; findings below may reference images not displayed]

FINDINGS: Brain: No evidence of acute infarction, hemorrhage, hydrocephalus,
extra-axial collection or mass lesion/mass effect.

Vascular: No hyperdense vessel or unexpected calcification.

Skull: Normal. Negative for fracture or focal lesion.

Sinuses/Orbits: No acute finding.

Other: None.
IMPRESSION: No acute intracranial pathology.

## 2023-12-28 NOTE — Progress Notes (Signed)
 Patient seen and examined.  Patient's chart reviewed.    [redacted]w[redacted]d   G2P1001  Patient does not have complaints Patient is taking prenatal vitamins.  Patient is not taking aspirin.  Patient states she has not taken all of pregnancy.  Explained it is too late to start taking now. Patient states she will get Tdap and RSV at her next visit.  Education sheet was provided on RSV through her AVS. Fetal movement Normal  Contractions no    Pain no  Vaginal bleeding no  Discharge no  Loss of fluid no Reviewed point of care testing. Reviewed 28 lab results.  yes Anemia.  Patient states she was unaware that she had anemia and iron  supplement was sent to her pharmacy.  Patient states she will pick it up today.  Patient is aware to take her iron  supplement with juice and at least 4 hours from her prenatal vitamin. Patient also had group B strep in her urine and was sent and antibiotics she has not picked those up either.  She states she will pick those up today Fetal kick counts and preterm labor precautions reviewed.    Information regarding third trimester changes given.  I have reviewed the benefits of breastfeeding for both mom and baby. Resources available to facilitate successful breastfeeding have been discussed as well.   Documentation for time-based billing:  Total time spent of date of service was 25 minutes.  Patient care activities included preparing to see the patient such as reviewing the patient record, obtaining and/or reviewing separately obtained history, performing a medically appropriate history and physical examination, counseling and educating the patient, family, and/or caregiver, ordering prescription medications, tests, or procedures, documenting clinical information in the electronic or other health record, independently interpreting results when not separately reported, communicating results to the patient/family/caregiver, and coordinating the care of the patient when not separately  reported.

## 2024-01-12 NOTE — Addendum Note (Signed)
 Addended by: SERGE PALMA on: 01/12/2024 10:41 AM  Modules accepted: Orders

## 2024-01-12 NOTE — Progress Notes (Signed)
 G2P1001 presenting at [redacted]w[redacted]d for a routine prenatal visit. --- Patient seen and chart reviewed.---  Complaints/concerns: states never picked up RX for the GBS in urine states needs new RX. Pt also states had episode of diarrhea 12/28/23 states passed something that looked like a worm Fetal movement: the patients states that the baby moves as usual NO contractions, pain, vaginal bleeding or discharge.    Reviewed point of care testing.  Patient is taking her prenatal vitamins daily.   Plan/Counseling Explained normal physiologic changes at this stage of pregnancy. Rx for Amoxicillin sent to pharmacy.  Pt passes a tubular substance through her stool on 9/9 that has resemblance to your tapeworm, will collect stool sample for O&P.  Reiterated normal fetal movement patterns and the utility of fetal kick counts. Reviewed what to expect over the next several visits.   Patient given the opportunity to ask questions and all questions answered to the patient's satisfaction.  Documentation for time-based billing:  Total time spent of date of service was 15 minutes.  Patient care activities included preparing to see the patient such as reviewing the patient record, obtaining and/or reviewing separately obtained history, performing a medically appropriate history and physical examination, counseling and educating the patient, family, and/or caregiver, ordering prescription medications, tests, or procedures, documenting clinical information in the electronic or other health record, and communicating results to the patient/family/caregiver.

## 2024-02-02 ENCOUNTER — Inpatient Hospital Stay (HOSPITAL_BASED_OUTPATIENT_CLINIC_OR_DEPARTMENT_OTHER)
Admission: AD | Admit: 2024-02-02 | Discharge: 2024-02-02 | Disposition: A | Discharge status: Home / Self Care | Attending: Obstetrics and Gynecology | Admitting: Obstetrics and Gynecology

## 2024-02-02 ENCOUNTER — Other Ambulatory Visit: Payer: Self-pay

## 2024-02-02 ENCOUNTER — Encounter (HOSPITAL_COMMUNITY): Payer: Self-pay | Admitting: Obstetrics and Gynecology

## 2024-02-02 DIAGNOSIS — Z3A35 35 weeks gestation of pregnancy: Secondary | ICD-10-CM | POA: Diagnosis not present

## 2024-02-02 DIAGNOSIS — O479 False labor, unspecified: Secondary | ICD-10-CM

## 2024-02-02 DIAGNOSIS — R109 Unspecified abdominal pain: Secondary | ICD-10-CM | POA: Diagnosis not present

## 2024-02-02 DIAGNOSIS — J45909 Unspecified asthma, uncomplicated: Secondary | ICD-10-CM | POA: Insufficient documentation

## 2024-02-02 DIAGNOSIS — O99513 Diseases of the respiratory system complicating pregnancy, third trimester: Secondary | ICD-10-CM | POA: Insufficient documentation

## 2024-02-02 DIAGNOSIS — O26893 Other specified pregnancy related conditions, third trimester: Secondary | ICD-10-CM

## 2024-02-02 LAB — URINALYSIS, ROUTINE W REFLEX MICROSCOPIC
Bilirubin Urine: NEGATIVE
Glucose, UA: NEGATIVE mg/dL
Hgb urine dipstick: NEGATIVE
Ketones, ur: NEGATIVE mg/dL
Nitrite: NEGATIVE
Specific Gravity, Urine: 1.02 (ref 1.005–1.030)
pH: 6.5 (ref 5.0–8.0)

## 2024-02-02 MED ORDER — CYCLOBENZAPRINE HCL 5 MG PO TABS: 5.0000 mg | ORAL_TABLET | Freq: Three times a day (TID) | ORAL | 0 refills | Status: AC | PRN

## 2024-02-02 MED ORDER — CYCLOBENZAPRINE HCL 5 MG PO TABS
10.0000 mg | ORAL_TABLET | Freq: Once | ORAL | Status: AC
  Administered 2024-02-02: 10 mg via ORAL
  Filled 2024-02-02: qty 2

## 2024-02-02 MED ORDER — CEFADROXIL 500 MG PO CAPS: 500.0000 mg | ORAL_CAPSULE | Freq: Two times a day (BID) | ORAL | 0 refills | Status: DC

## 2024-02-02 MED ORDER — CEFADROXIL 500 MG PO CAPS
500.0000 mg | ORAL_CAPSULE | Freq: Two times a day (BID) | ORAL | 0 refills | Status: AC
Start: 2024-02-02 — End: 2024-02-09

## 2024-02-02 MED ORDER — CYCLOBENZAPRINE HCL 5 MG PO TABS: 5.0000 mg | ORAL_TABLET | Freq: Three times a day (TID) | ORAL | 0 refills | Status: DC | PRN

## 2024-02-02 MED ORDER — ACETAMINOPHEN 500 MG PO TABS
1000.0000 mg | ORAL_TABLET | Freq: Once | ORAL | Status: AC
Start: 2024-02-02 — End: 2024-02-02
  Administered 2024-02-02: 1000 mg via ORAL
  Filled 2024-02-02: qty 2

## 2024-02-02 NOTE — Progress Notes (Signed)
 Called and spoke with [place] at [facility] and asked him to adjust fhr monitor.  He says that he has been timing what they believe are UCs and reports that they are occurring about every 5 minutes. I explained to him how to adjust toco in an effort to better trace UCs.

## 2024-02-02 NOTE — ED Notes (Signed)
 Fetal HR 145-155 bpm.

## 2024-02-02 NOTE — ED Notes (Signed)
 Called [place] at [facility] for transport, Dr. Bebe Furry accepting

## 2024-02-02 NOTE — Progress Notes (Signed)
 Pt is G2P1 at approximately 35.[redacted] wks pregnant who presented to [facility] ED complaining of abdominal pain that wraps around towards her back.  She endorses positive fetal movement and denies LOF or vaginal bleeding.  Pt receives care at [facility].  FHR monitors are being applied and rrob will assess remotely.

## 2024-02-02 NOTE — ED Provider Notes (Signed)
 [place] EMERGENCY DEPARTMENT AT [facility] Provider Note   CSN: [id number] Arrival date & time: 02/02/24  1512     Patient presents with: Abdominal Pain   Monica Burgess is a 28 y.o. female.    Abdominal Pain    Pt is G2p at approx 25 weeks who presents with abd pain.  Pt states it started last night.  Comes and goes, maybe every hour or so.  Lasts maybe 30 minutes.  She feels pressure in the pelvic and rectal area.  Does not know if it feels like contractions.  No vomiting or diarrhea.  No dysuria.  No vag bleeding or discharge.  Patient has been getting her prenatal care at a facility affiliated with [facility] near her home however the patient works here in [place] so she came here today for evaluation  Prior to Admission medications   Medication Sig Start Date End Date Taking? Authorizing Provider  albuterol  (PROVENTIL ) (2.5 MG/3ML) 0.083% nebulizer solution Take 3 mLs (2.5 mg total) by nebulization every 6 (six) hours as needed for wheezing or shortness of breath. 02/22/23   Zelaya, Oscar A, PA-C  albuterol  (VENTOLIN  HFA) 108 (90 Base) MCG/ACT inhaler Inhale 1-2 puffs into the lungs every 6 (six) hours as needed for wheezing or shortness of breath. 02/22/23   Zelaya, Oscar A, PA-C  ferrous sulfate 325 (65 FE) MG EC tablet Take 325 mg by mouth daily.    [provider]  furosemide  (LASIX ) 20 MG tablet Take 1 tablet (20 mg total) by mouth daily. 04/15/21   Cleatus Moccasin, MD  NIFEdipine  (ADALAT  CC) 30 MG 24 hr tablet Take 1 tablet (30 mg total) by mouth daily. 04/15/21   Cleatus Moccasin, MD  Prenatal Vit-Fe Fumarate-FA (PRENATAL MULTIVITAMIN) TABS tablet Take 1 tablet by mouth daily at 12 noon. 04/14/21   Cleatus Moccasin, MD    Allergies: Patient has no known allergies.    Review of Systems  Gastrointestinal:  Positive for abdominal pain.    Updated Vital Signs BP 135/71   Pulse 79   Temp 97.8 F (36.6 C) (Oral)   Resp 14   SpO2 96%   Physical  Exam Vitals and nursing note reviewed.  Constitutional:      General: She is not in acute distress.    Appearance: She is well-developed.  HENT:     Head: Normocephalic and atraumatic.     Right Ear: External ear normal.     Left Ear: External ear normal.  Eyes:     General: No scleral icterus.       Right eye: No discharge.        Left eye: No discharge.     Conjunctiva/sclera: Conjunctivae normal.  Neck:     Trachea: No tracheal deviation.  Cardiovascular:     Rate and Rhythm: Normal rate.  Pulmonary:     Effort: Pulmonary effort is normal. No respiratory distress.     Breath sounds: No stridor.  Abdominal:     General: There is no distension.     Comments: Gravid uterus  Genitourinary:    Vagina: No vaginal discharge or bleeding.     Comments: Sterile bimanual exam, cervix posterior and high, closed Musculoskeletal:        General: No swelling or deformity.     Cervical back: Neck supple.  Skin:    General: Skin is warm and dry.     Findings: No rash.  Neurological:     Mental Status: She is alert.  Mental status is at baseline.     Cranial Nerves: No dysarthria or facial asymmetry.     Motor: No seizure activity.     (all labs ordered are listed, but only abnormal results are displayed) Labs Reviewed  URINALYSIS, ROUTINE W REFLEX MICROSCOPIC - Abnormal; Notable for the following components:      Result Value   APPearance HAZY (*)    Protein, ur TRACE (*)    Leukocytes,Ua LARGE (*)    Bacteria, UA RARE (*)    All other components within normal limits    EKG: None  Radiology: No results found.   Procedures   Medications Ordered in the ED - No data to display  Clinical Course as of 02/02/24 1612  Wed Feb 02, 2024  1557 Call placed to Dr Izell [JK]  1610 Case reviewed with Dr. Izell.  We will send patient to the [facility] at [facility] for further evaluation [JK]  1612 Urinalysis not suggestive of infection [JK]    Clinical Course  User Index [JK] Randol Simmonds, MD                                 Medical Decision Making Problems Addressed: Abdominal pain during pregnancy in third trimester: acute illness or injury that poses a threat to life or bodily functions  Amount and/or Complexity of Data Reviewed Labs: ordered. Decision-making details documented in ED Course. Discussion of management or test interpretation with external provider(s): Case discussed with Dr. Izell OB/GYN  Risk Decision regarding hospitalization.   Patient is presenting with abdominal pain coming in intervals.  She is in the third trimester pregnancy.  Her symptoms are concerning for the possibility of contractions.  Patient does not appear to be at risk for imminent delivery at this time.  I have contacted Dr. Izell at the MAU.  We will transfer the patient from the freestanding ED here to the ER for further evaluation.  I discussed the plans with the patient and she is in agreement     Final diagnoses:  Abdominal pain during pregnancy in third trimester    ED Discharge Orders     None          Randol Simmonds, MD 02/02/24 [phone]

## 2024-02-02 NOTE — ED Notes (Signed)
 Patient reports baby is more active than normal.

## 2024-02-02 NOTE — ED Notes (Signed)
 Patient unsure of feeling contractions or not. Describing spasms in rectum. Approx 5 min intervals.

## 2024-02-02 NOTE — MAU Note (Addendum)
 28yoF at 35 weeks presents with sharp rectal pain since last night intermittently (about 5x and hour lasting for 10-15 seconds). Feels like baby is moving more than normal and wants to make sure she's not in distress. Reports shakiness, dizziness, and nausea since 0700 this morning, tried eating something and that did not help. Has history of asthma (diagnosed at 37), normally uses 1x/month but for the past week has had to use twice a day for shortness of breath. Endorses bleeding on toilet tissue upon arrival to MAU but none prior. Denies leaking of fluid. Vitals WNL.

## 2024-02-02 NOTE — ED Notes (Signed)
 Report given to [facility]U and [facility].

## 2024-02-02 NOTE — ED Triage Notes (Signed)
 Pt to ED reporting increased fetal movement with abd, rectal and pelvic pain since last night. Pain reportedly coming and going at 30 minute increments every hour. Nausea without vomiting. No vaginal discharge, bleeding or need to push.

## 2024-02-02 NOTE — MAU Provider Note (Addendum)
 History     CSN: [id number]  Arrival date and time: 02/02/24 1512   Event Date/Time   First Provider Initiated Contact with Patient 02/02/24 1839      Chief Complaint  Patient presents with   Abdominal Pain   HPI [name] is a 28 y.o. year old [id number] female at [redacted]w[redacted]d weeks gestation who was transferred to MAU from [facility]. She was seen there for intermittent abdominal pain since last night; ~ 5/hour lasting 10-15 seconds. She also reports sharp rectal pains. She also states she has had increased FM that concerns her; want to make sure the baby is not in distress. She reports shakiness, dizziness, and nausea since 0700. She tried to eat something, but that did not help. She has a h/o asthma. She reports SOB x 1 week; I've been having to use my inhaler twice a day vs. monthly like usual. She reports some bleeding noted on tissue after wiping in MAU bathroom. She denies LOF. She receives [facility] with OB provider associated with [facility].   OB History     Gravida  2   Para  1   Term  1   Preterm      AB      Living  1      SAB      IAB      Ectopic      Multiple  0   Live Births  1           Past Medical History:  Diagnosis Date   Asthma     History reviewed. No pertinent surgical history.  Family History  Problem Relation Age of Onset   Cancer Mother     Social History   Tobacco Use   Smoking status: Never  Vaping Use   Vaping status: Never Used  Substance Use Topics   Alcohol use: Not Currently    Comment: Occ   Drug use: No    Allergies: No Known Allergies  Medications Prior to Admission  Medication Sig Dispense Refill Last Dose/Taking   albuterol  (PROVENTIL ) (2.5 MG/3ML) 0.083% nebulizer solution Take 3 mLs (2.5 mg total) by nebulization every 6 (six) hours as needed for wheezing or shortness of breath. 75 mL 12 02/02/2024   ferrous sulfate 325 (65 FE) MG EC tablet Take 325 mg by mouth daily.   02/02/2024    Prenatal Vit-Fe Fumarate-FA (PRENATAL MULTIVITAMIN) TABS tablet Take 1 tablet by mouth daily at 12 noon. 90 tablet 1 02/02/2024   albuterol  (VENTOLIN  HFA) 108 (90 Base) MCG/ACT inhaler Inhale 1-2 puffs into the lungs every 6 (six) hours as needed for wheezing or shortness of breath. 1 each 0    furosemide  (LASIX ) 20 MG tablet Take 1 tablet (20 mg total) by mouth daily. 5 tablet 0    NIFEdipine  (ADALAT  CC) 30 MG 24 hr tablet Take 1 tablet (30 mg total) by mouth daily. 30 tablet 1     Review of Systems  Constitutional: Negative.   HENT: Negative.    Eyes: Negative.   Respiratory: Negative.    Cardiovascular: Negative.   Gastrointestinal: Negative.   Endocrine: Negative.   Genitourinary:  Positive for pelvic pain.  Musculoskeletal:  Positive for back pain.  Skin: Negative.   Allergic/Immunologic: Negative.   Neurological: Negative.   Hematological: Negative.   Psychiatric/Behavioral: Negative.     Physical Exam   Blood pressure 132/64, pulse 82, temperature 98.4 F (36.9 C), temperature source Oral, resp. rate 18, height 5' 2 (  1.575 m), weight 94.3 kg, SpO2 98%, unknown if currently breastfeeding.  Physical Exam Vitals and nursing note reviewed. Exam conducted with a chaperone present.  Constitutional:      Appearance: Normal appearance. She is obese.  Cardiovascular:     Rate and Rhythm: Normal rate.  Pulmonary:     Effort: Pulmonary effort is normal.     Breath sounds: Normal breath sounds.  Abdominal:     Palpations: Abdomen is soft.  Genitourinary:    General: Normal vulva.     Comments: Dilation: Closed (Ext Os = 1 cm; Int Os = closed) Effacement (%): Thick Cervical Position: Posterior Station: Ballotable Presentation: Undeterminable Exam by: Letha CNM  Musculoskeletal:        General: Normal range of motion.  Skin:    General: Skin is warm and dry.  Neurological:     Mental Status: She is alert and oriented to person, place, and time.  Psychiatric:         Mood and Affect: Mood normal.        Behavior: Behavior normal.        Thought Content: Thought content normal.        Judgment: Judgment normal.    REACTIVE NST - FHR: 135 bpm / moderate variability / accels present / decels absent / TOCO: irregular UCs  MAU Course    MDM level 3  Results for orders placed or performed during the hospital encounter of 02/02/24 (from the past 24 hours)  Urinalysis, Routine w reflex microscopic -Urine, Clean Catch     Status: Abnormal   Collection Time: 02/02/24  3:35 PM  Result Value Ref Range   Color, Urine YELLOW YELLOW   APPearance HAZY (A) CLEAR   Specific Gravity, Urine 1.020 1.005 - 1.030   pH 6.5 5.0 - 8.0   Glucose, UA NEGATIVE NEGATIVE mg/dL   Hgb urine dipstick NEGATIVE NEGATIVE   Bilirubin Urine NEGATIVE NEGATIVE   Ketones, ur NEGATIVE NEGATIVE mg/dL   Protein, ur TRACE (A) NEGATIVE mg/dL   Nitrite NEGATIVE NEGATIVE   Leukocytes,Ua LARGE (A) NEGATIVE   RBC / HPF 0-5 0 - 5 RBC/hpf   WBC, UA 6-10 0 - 5 WBC/hpf   Bacteria, UA RARE (A) NONE SEEN   Squamous Epithelial / HPF 11-20 0 - 5 /HPF   Mucus PRESENT     Report given to and care assumed by Barkley Angles, MD. Will recheck cervix in another 1 hour.  Ala Letha, CNM 02/02/2024, 6:39 PM      Assessment and Plan   Rechecked in 1.5 hours and no cervical change noted. U/A could possibly indicate early UTI so will treat with Duricef 500mg  x7 days. Urine culture pending.  Pain much improved with tylenol  and flexeril. Rx for flexeril sent to patient pharmacy.  -Safe to discharge home  Barkley Angles, MD OB Fellow, [facility] [place] [facility] [employer]

## 2024-02-02 NOTE — Progress Notes (Signed)
 Dr Izell notified of pt and status. Informed that the ED providers are timing out what they believe to be ucs every although they are unable to pick them up on [facility].  He is also notified of normal BP and urinalysis result.  Dr Izell would like pt to get a labor evaluation.  Therefore she will be transferred to MAU via [facility].  [facility] made aware and MAU given report.

## 2024-02-04 LAB — CULTURE, OB URINE: Culture: <10000 — AB

## 2024-03-06 NOTE — H&P (Signed)
 [facility]  History and Physical   Assessment  Active Hospital Problems   Puerperal sepsis without acute organ dysfunction (*)  The patient is a 28 year old G2 P2-0-0-2 approximately 2-week status post spontaneous vaginal delivery presenting with sepsis of uncertain source.  The patient is hemodynamically stable but does meet SIRS criteria with recent outpatient findings suggesting potential multi-system involvement.  Plan   Sepsis protocol has been implemented and a code sepsis called.  The patient will be started on IV fluid hydration and broad-spectrum empiric antibiotics.  The differential diagnosis includes acute lactational mastitis as suspected by her outpatient encounter, acute pulmonary infection including pneumonia, genitourinary infection such as cystitis or endometritis.  Given the patient's presentation I have recommended EKG, proBNP, CTA of the chest in addition to standard sepsis evaluation.  Continue close monitoring of vital signs per the sepsis protocol.  RT eval and treat has been ordered given the patient's wheezing on auscultation. The patient verbalized understanding of these recommendations and agrees to admission for observation.   Fluid and electrolyte disorder: Hyponatremia, present on admission  Plan: Repeat BMP in AM  Abnormal blood counts: Leukocytosis, present on admission Plan: Repeat CBC in AM and Initiate broad-spectrum antibiotics  Nutritional status: Body mass index is 36.03 kg/m. Obesity Plan: PCP follow up No diet orders on file  Coagulation defect: Not present, present on admission Plan: N/A  Elevated LFTs/transaminitis due to: Resolving pre-eclampsia, present on admission Plan: Repeat CMP in AM  Debility: N/A and Not present, present on admission Plan: N/A  History  Monica Burgess is a 28 y.o. female who presents for evaluation of complaints of malaise, headache, cough, dyspnea, right breast pain, and  abdominal pain.  Monica Burgess was seen for postpartum follow-up visit with Dr. Damien earlier this afternoon.  During that visit Dr. Zena even noted that the patient did not appear to be well.  She was complaining of feeling unwell for the prior 24 hours.  This was subsequent to pain in the right breast.  She is pumping and breast-feeding currently.  She has had no unilateral breast complaints until the past 24 hours.  She also notes a productive cough and shortness of breath or difficulty catching a full breath.  During her office visit she was noted to be tachycardiac with oxygen saturation at approximately 93 to 94%.  The patient also notes some abdominal pain just inferior to the umbilicus.  She denies dysuria, hematuria, frequency, constipation, diarrhea, nausea, and vomiting.  She does also note a headache.  She describes it as increasing pressure within the head.  She denies visual symptoms.  Since arriving to the hospital she has also noticed paresthesias and numbness of the bilateral lower extremities. She denies worsening edema or unilateral calf pain or tenderness. She denies recent trauma. Her lochia has been improving recently. She denies purulent vaginal discharge.   OB History  Gravida Para Term Preterm AB Living  2 2 2  0 0 2  SAB IAB Ectopic Multiple Live Births  0 0 0 0 2    # Outcome Date GA Lbr Len/2nd Weight Sex Type Anes PTL Lv  2 Term 02/22/24 [redacted]w[redacted]d / 00:26 3.3 kg (7 lb 4.4 oz) F Vag-Spont EPI N LIV     Name: Monica Burgess     Apgar1: 8  Apgar5: 9  1 Term 04/12/21 [redacted]w[redacted]d 03:10 / 04:56 3.54 kg (7 lb 12.9 oz) F Vag-Spont EPI N LIV     Birth Comments: wdl  Name: Monica Burgess,Monica Burgess     Apgar1: 9  Apgar5: 9   Past Medical History:  Diagnosis Date  . Asthma (*)   . Postpartum depression   . Preeclampsia (*)    during pregnancy   History reviewed. No pertinent surgical history.  Allergies[1] Prior to Admission medications  Medication Sig Start Date End Date Taking?  Authorizing Provider  Blood Pressure Monitoring (BLOOD PRESSURE CUFF) MISC 1 each by Does not apply route 2 (two) times daily. Pt weight  205 lbs , needs bp cuff /appropriate size for arm 02/29/24   Laymon CHRISTELLA Hoa, MD  dicloxacillin (DYNAPEN) 500 MG capsule Take one capsule (500 mg dose) by mouth 4 (four) times daily for 10 days. 03/06/24 03/16/24  Darian M Laneave, MD  ferrous sulfate 325 (65 FE) MG tablet Take one tablet (325 mg dose) by mouth daily. 02/23/24  Yes Alm Vicenta Argyle, MD  ibuprofen  (ADVIL ,MOTRIN ) 600 mg tablet Take one tablet (600 mg dose) by mouth every 6 (six) hours as needed for Pain (for Pain level 4-6). 03/02/24  Yes Geni LOISE Pedro, MD  NIFEdipine  (PROCARDIA  XL) 30 mg 24 hr tablet Take one tablet (30 mg dose) by mouth daily. 02/29/24  Yes Nona M Smith, CNM  Prenatal Vit-Fe Phos-FA-Omega (VITAFOL GUMMIES) 3.33-0.333-34.8 MG CHEW tablet Chew three tablets by mouth daily. Chew 3 gummies daily 02/29/24 02/28/25 Yes Nona M Smith, CNM  VENTOLIN  HFA 108 (90 Base) MCG/ACT inhaler Inhale two puffs into the lungs every 6 (six) hours as needed for Wheezing. 02/29/24  Yes Nona M Smith, CNM   Social History[2] Family History  Problem Relation Age of Onset  . Diabetes Father   . Breast cancer Mother 81  . Colon cancer Neg Hx   . Hypertension Neg Hx   . Polycystic kidney disease Neg Hx   . Sickle cell anemia Neg Hx   . Thalassemia Neg Hx   . Thrombophilia Neg Hx   . Stroke Neg Hx   . Miscarriages / Stillbirths Neg Hx   . Pre-eclampsia Neg Hx   . Meningomyelocele Neg Hx   . Anencephaly Neg Hx   . Preterm labor Neg Hx   . Spina bifida Neg Hx   . Autism Neg Hx   . Birth defects Neg Hx   . Canavan disease Neg Hx   . Chromosomal disorder Neg Hx   . Congenital heart disease Neg Hx   . Cystic fibrosis Neg Hx   . Down syndrome Neg Hx   . Familial dysautonomia Neg Hx   . Fragile X syndrome Neg Hx   . Hemophilia Neg Hx   . Intellectual Disability  Neg Hx   . Huntington's disease  Neg Hx   . Muscular dystrophy Neg Hx   . PKU Neg Hx   . Tay-Sachs disease Neg Hx    Review of Systems  Constitutional:  Positive for fever. Negative for chills.  Respiratory:  Positive for cough, shortness of breath and wheezing.   Cardiovascular:  Positive for chest pain.  Gastrointestinal:  Positive for abdominal pain. Negative for nausea and vomiting.  Genitourinary:  Negative for difficulty urinating, dysuria, pelvic pain and vaginal discharge.  Musculoskeletal:  Negative for arthralgias and myalgias.  Neurological:  Positive for light-headedness and headaches. Negative for weakness.  Psychiatric/Behavioral:  Negative for dysphoric mood.       Physical Examination  Temp:  [97.1 F (36.2 C)-101.9 F (38.8 C)] 99.4 F (37.4 C) Heart Rate:  [104-138] 104 Resp:  [22] 22  BP: (117-158)/(65-85) 117/65 SpO2:  [91 %-96 %] 95 % Pain Score:   5 O2 Device: LMA;None (Room air)    Physical Exam Vitals and nursing note reviewed.  Constitutional:      General: She is not in acute distress. Musculoskeletal:        General: Normal range of motion.     Cervical back: Normal range of motion.  Pulmonary:     Effort: Pulmonary effort is normal. No respiratory distress.     Breath sounds: No decreased air movement. Examination of the right-middle field reveals wheezing. Examination of the right-lower field reveals wheezing. Wheezing present. No decreased breath sounds, rhonchi or rales.  Abdominal:     General: Abdomen is flat. There is no distension.     Palpations: Abdomen is soft.     Tenderness: There is abdominal tenderness in the periumbilical area. There is no guarding or rebound.  Genitourinary:    Uterus: Enlarged (Fundus 2 cm inferior to the umbilicus, firm, and nontender). Not deviated, not fixed and not tender.   Neurological:     Mental Status: She is alert and oriented to person, place, and time.     Sensory: No sensory deficit (decreased sensation in the lower extremities  below the level of the thighs).   Results  Labs:  Recent Results (from the past 24 hours)  Urinalysis w/ Reflex Microscopic; Reflex to Culture - Symptomatic   Collection Time: 03/06/24  2:49 PM  Result Value Ref Range   Urine Color Orange (A) Yellow    Urine Clarity Turbid (A) Clear   Urine Specific Gravity 1.031 (H) 1.005 - 1.030   Urine pH 6.0 5 to 9   Urine Protein - Dipstick 70 (A) Negative mg/dl   Urine Glucose Negative Negative mg/dL   Urine Ketones Negative Negative mg/dl   Urine Bilirubin Negative Negative mg/dL   Urine Blood 1.0 (A) Negative mg/dL   Urine Nitrite Negative Negative   Urine Urobilinogen <2 <2 mg/dl   Urine Leukocyte Esterase 25 (A) Negative Leu/mcL   Urine Squamous Epithelial Cells 0 0 - 2 /HPF   Urine WBC 0-2 0 - 2 /hpf   Urine RBC 0-2 0 - 2 /HPF   Urine Bacteria Trace None /HPF   UA Amorphous Many (A) Rare, Few, Occasional, None /HPF   UA Microscopic Yes Micro (A) No Micro  CBC And Differential   Collection Time: 03/06/24  2:52 PM  Result Value Ref Range   WBC 18.7 (H) 4.0 - 10.5 thou/mcL   RBC 5.36 (H) 3.93 - 5.22 million/mcL   HGB 12.9 11.2 - 15.7 gm/dL   HCT 57.8 65.8 - 55.0 %   MCV 78.5 (L) 79.4 - 94.8 fL   MCH 24.1 (L) 25.6 - 32.2 pg   MCHC 30.6 (L) 32.2 - 35.5 gm/dL   Plt Ct 653 849 - 599 thou/mcL   RDW SD 50.9 (H) 35.1 - 46.3 fL   MPV 10.2 9.4 - 12.4 fL   NRBC% 0.0 0.0 - 0.2 /100WBC   Absolute NRBC Count 0.00 0.00 - 0.01 thou/mcL   NEUTROPHIL % 88.7 %   LYMPHOCYTE % 4.5 %   MONOCYTE % 5.1 %   Eosinophil % 0.7 %   BASOPHIL % 0.2 %   IG% 0.8 %   ABSOLUTE NEUTROPHIL COUNT 16.60 (H) 1.50 - 7.50 thou/mcL   ABSOLUTE LYMPHOCYTE COUNT 0.84 (L) 1.00 - 4.50 thou/mcL   Absolute Monocyte Count 0.96 (H) 0.10 - 0.80 thou/mcL   Absolute Eosinophil  Count 0.13 0.00 - 0.50 thou/mcL   Absolute Basophil Count 0.03 0.00 - 0.20 thou/mcL   Absolute Immature Granulocyte Count 0.15 (H) 0.00 - 0.03 thou/mcL   Comprehensive Metabolic Panel   Collection  Time: 03/06/24  2:52 PM  Result Value Ref Range   Na 135 (L) 136 - 146 mmol/L   Potassium 3.7 3.7 - 5.4 mmol/L   Cl 100 97 - 108 mmol/L   CO2 18 (L) 20 - 32 mmol/L   AGAP 17 (H) 7 - 16 mmol/L   Glucose 125 (H) 65 - 99 mg/dL   BUN 10 6 - 20 mg/dL   Creatinine 9.27 9.42 - 1.00 mg/dL   Ca 9.5 8.7 - 89.7 mg/dL   ALK PHOS 873 25 - 849 U/L   T Bili 0.4 0.0 - 1.2 mg/dL   Total Protein 8.3 6.0 - 8.5 gm/dL   Alb 4.5 3.5 - 5.5 gm/dL   GLOBULIN 3.8 1.5 - 4.5 gm/dL   ALBUMIN/GLOBULIN RATIO 1.2 1.1 - 2.5   BUN/CREAT RATIO 13.9 11.0 - 26.0   ALT 76 (H) 0 - 40 U/L   AST 31 0 - 40 U/L   eGFR 116 >=60 mL/min/1.52m2  NT-proBNP   Collection Time: 03/06/24  2:52 PM  Result Value Ref Range   NT-ProBNP <36 <=449 pg/mL  Lactic Acid - Yes reflex 2 hour   Collection Time: 03/06/24  2:52 PM  Result Value Ref Range   Lactic Acid 2.6 (HH) 0.5 - 2.0 mmol/L  Lactic Acid Venous Resp Lab - Yes reflex 2 hour   Collection Time: 03/06/24  2:53 PM  Result Value Ref Range   LACTIC ACID VENOUS 2.70 (HH) 0.50 - 2.00 mmol/L  ECG 12-Lead   Collection Time: 03/06/24  2:58 PM  Result Value Ref Range   Acquisition Device [id number]    Ventricular Rate 133 BPM   Atrial Rate 133 BPM   P-R Interval 128 ms   QRS Duration 84 ms   Q-T Interval 304 ms   QTC Calculation(Bazett) 452 ms   Calculated P Axis 68 degrees   Calculated R Axis 83 degrees   Calculated T Axis 39 degrees   ECG Diagnosis      Sinus tachycardia Cannot rule out Anterior infarct , age undetermined Abnormal ECG No previous ECGs available   Reflexed Lactic Acid -   Collection Time: 03/06/24  4:55 PM  Result Value Ref Range   Lactic Acid 0.7 0.5 - 2.0 mmol/L    Imaging: CT Head WO Contrast Result Date: 03/06/2024 INDICATION:    Headache, COMPARISON:   None. TECHNIQUE:    Multiple axial images obtained from the skull base to the vertex without IV contrast  were obtained on  03/06/2024 4:21 PM FINDINGS: #  No evidence of hydrocephalus. #  No  intracranial mass, mass effect or midline shift. #  No acute large territory infarction evident. #  No intracranial hemorrhage. #  Paranasal sinuses: Clear. #  Mastoid air cells: Clear. #  Calvarium: Intact.   IMPRESSION: No acute intracranial abnormality. Electronically Signed by: Emeline JINNY Poisson, MD on 03/06/2024 4:55 PM  CT Angio Chest Pulmonary Result Date: 03/06/2024 INDICATION: Chest Pain. COMPARISON:  None. TECHNIQUE:  Routine CT pulmonary angiogram with contrast was performed using 90 mL Isovue 370 administered intravenously.  Multiplanar 2D and angiographic 3D MIP images were constructed and reviewed. Radiation dose reduction was utilized (automated exposure control, mA or kV adjustment based on patient size, or iterative image reconstruction). Contrast bolus quality:  satisfactory FINDINGS: SUPPORT APPARATUS: - N.A. PULMONARY EMBOLISM: - No pulmonary emboli are identified. LUNGS/PLEURA: - Heterogeneous diffuse hazy/groundglass pulmonary opacities. - No pneumothorax. - No abnormal pulmonary masses. - No pleural effusions. HEART/MEDIASTINUM: - Cardiac size is normal. - No coronary artery calcifications. - No acute thoracic aortic abnormalities. - No hilar, mediastinal, or axillary lymphadenopathy. MUSCULOSKELETAL: - No acute or destructive osseous processes. MISC: - N.A.   IMPRESSION: No pulmonary embolism. No consolidations. Bilateral heterogeneously distributed from glass opacities may represent nonspecific pneumonitis or edema. Electronically Signed by: Lynwood Murders, DO on 03/06/2024 5:18 PM     Electronically signed: Fairy JONELLE Sickle, MD 03/06/2024 / 8:48 PM        [1] No Known Allergies [2] Social History Socioeconomic History  . Marital status: Significant Other  Tobacco Use  . Smoking status: Never    Passive exposure: Never  . Smokeless tobacco: Never  Vaping Use  . Vaping status: Never Used  Substance and Sexual Activity  . Alcohol use: Never  . Drug use: Never   . Sexual activity: Not Currently    Partners: Male

## 2024-03-06 NOTE — Progress Notes (Signed)
 Pharmacy Consult - Vancomycin Dosing  Patient Name: Mckenna Gamm  Indication: Intra-abdominal Infection Goal: AUC:MIC 400-600 mcg*h/mL  Vancomycin Start Date: 11/17 Day of Therapy: 1  Consult End Date: 11/18   Current Dosing Regimen: New start  Other Antimicrobial Agents: Zosyn Other Nephrotoxins: Piperacillin-Tazobactam Labs:  Recent Labs    Units 03/06/24 1452 03/01/24 1906 02/29/24 1605  WBC thou/mcL 18.7* 8.5 7.8  CREATININE mg/dL 9.27 9.32 9.40    Estimated Creatinine Clearance: 108 mL/min (by C-G formula based on SCr of 0.72 mg/dL).  Temperature over 24 hours:  101.9 F  Microbiology:  11/17 Blood x 2: in process   Pertinent Findings/Imaging:  --   InsightRx Data based on current dose of 1500 mg Q12H   AUC:MIC Target Achieved 545 mg*h/L  Predicted Trough Target 14.3 mcg/mL  Probability of AUC Target Attainment (pAUC, target >= 80%) 82%  Probability of Toxicity (Tox, target <= 20%) 9%     Levels:  New start  Assessment/Plan:  Patient presents from post-partum clinic visit with wheezing, tachycardia, hypoxia, possible right breast mastitis. Patient delivered baby nearly two weeks ago. Admitted for observation. Empiric antibiotics started for 24 hours.   -No current plan for levels unless vancomycin consult extended.  -Pharmacist will continue to monitor and adjust regimen as appropriate.   Honora GORMAN Geralds 03/06/2024 / 4:04 PM

## 2024-03-07 NOTE — Progress Notes (Signed)
 The patient is having persistent fever despite antimicrobial therapy and acetaminophen . Her primary complaint is abdominopelvic pain. This further raises suspicion for septic pelvic thrombophlebitis. CT of the abdomen and pelvis with IV contrast has been ordered. Unless an alternative source is identified will plan to initiate anticoagulation for management of SPT.

## 2024-03-08 NOTE — Progress Notes (Signed)
 [facility]  Progress Note     A:   Puerperal sepsis without acute organ dysfunction  2-week status post Vaginal Delivery G2P2 mastitis  P:   Routine care.  Start on oral antibiotic. Will continue q-12hr Lovenox for 7-10days. Discharge tomorrow. Cultures still pending. Close follow-up in the office  S:   Patient is doing well.  Pain is well controlled.  Bleeding is normal.  Afebrile for 24hrs.   O:      Vitals:   03/08/24 0736  BP: 138/76  Pulse: 73  Resp: 18  Temp: 98.4 F (36.9 C)  SpO2: 96%      Electronically Signed: Tinnie CHRISTELLA Levine, CNM 03/08/2024 10:13 AM

## 2024-03-09 NOTE — Discharge Summary (Signed)
 [facility]   Obstetrics Discharge Note  Discharge Details   Admit date:         03/06/2024 Discharge date:         03/09/2024  Hospital Days:    3 days  Active Hospital Problems   Diagnosis Date Noted POA  . *Puerperal sepsis without acute organ dysfunction (*) 03/06/2024 Yes  . Postpartum pelvic thrombophlebitis (*) 03/08/2024 Yes  . Pre-eclampsia in postpartum period (*) 03/02/2024 Yes    Resolved Hospital Problems  No resolved problems to display.      Current Discharge Medication List     START taking these medications      Details  dicloxacillin 500 MG capsule Commonly known as: DYNAPEN  Take one capsule (500 mg dose) by mouth 4 (four) times daily for 10 days. Quantity: 40 capsule       CONTINUE these medications which have NOT CHANGED      Details  Blood Pressure Cuff Misc  1 each by Does not apply route 2 (two) times daily. Pt weight  205 lbs , needs bp cuff /appropriate size for arm Quantity: 1 each   ferrous sulfate 325 (65 FE) MG tablet  Take one tablet (325 mg dose) by mouth daily. Quantity: 30 tablet   ibuprofen  600 mg tablet Commonly known as: ADVIL ,MOTRIN   Take one tablet (600 mg dose) by mouth every 6 (six) hours as needed for Pain (for Pain level 4-6). Indication: Pain Quantity: 30 tablet   NIFEdipine  30 mg 24 hr tablet Commonly known as: PROCARDIA  XL  Take one tablet (30 mg dose) by mouth daily. Quantity: 30 tablet   VENTOLIN  HFA 108 (90 Base) MCG/ACT inhaler Generic drug: albuterol  sulfate HFA  Inhale two puffs into the lungs every 6 (six) hours as needed for Wheezing. Quantity: 18 g   VITAFOL GUMMIES 3.33-0.333-34.8 MG Chew tablet  Chew three tablets by mouth daily. Chew 3 gummies daily Quantity: 90 tablet      * You might also be taking other medications not listed above. If you have questions about any of your other medications, talk to the person who prescribed them or your Primary Care Provider.            Assessment/Plan   Active Hospital Problems   Diagnosis Date Noted POA  . *Puerperal sepsis without acute organ dysfunction (*) 03/06/2024 Yes  . Postpartum pelvic thrombophlebitis (*) 03/08/2024 Yes  . Pre-eclampsia in postpartum period (*) 03/02/2024 Yes    Resolved Hospital Problems  No resolved problems to display.    Physicians involved in care during this hospitalization Attending Provider: Fairy JONELLE Sickle, MD Admitting Provider: Fairy JONELLE Sickle, MD Bedside Procedures   No orders found       Condition at Discharge: good  Activity Instructions     Avoid driving     For ___________weeks   Avoid strenuous lifting, pulling, pushing     For _________________ weeks   Gradually increase daily activities until you are back to your normal routine     May Shower        Diet Instructions     Drink 8-10 glasses water per day     Regular Diet     No restrictions      Other Instructions     Change pads frequently     Discharge Follow-Up:  call the office for postpartum appointment in 6 weeks     Discharge Follow-Up:  office will contact you with your next  appointment     1- week follow-up appointment   Discharge instructions: nothing in vagina for 6 weeks     No sex, tampons or douching   Discharge instructions: wash hands after using the bathroom, before breast feeding and before handling your baby     Follow the instructions of your healthcare provider for use of laxatives, stool softeners and pain medications     Notify  physician of signs of infection     around cesarean incision pain that may be sudden or gradually get stronger around your incision, separation around the edges, very red, increased swelling, 3leaking any fluids from your incision   Notify physician of blurred vision, spots in your vision, or headache not relieved by Tylenol      Notify physician of constipation longer than three days     Notify physician of crying spells or  depression that feels out of control     Notify physician of difficulty breathing, dizziness or fainting     Notify physician of fever over 100.4 F     Notify physician of heavy or prolonged bleeding     Notify physician of pain that becomes worse and not controlled by your pain medication     Notify physician of painful, firm, red area on one breast or with flulike symptoms or cracked bleeding nipples     Notify physician of passing large blood clots     Notify physician of urination (peeing) that is painful, burns, is difficult or too frequent        Contact information for follow-up     [facility]   [address] [place] [address]  Phone: [phone]     Next Steps: Schedule an appointment as soon as possible for a visit in 1 week(s)   Instructions: Post-Partum Visit with provider for hospital follow-up, Follow-up Hospital Admission      Appointments which have been scheduled    Mar 15, 2024 1:30 PM Postpartum with Waddell Sales, PA-C [facility] [place] (--) [id number] [place] [address] [phone]   Apr 04, 2024 1:00 PM Postpartum with Keane CHRISTELLA Sharps, CNM [facility] [place] (--) [address] [place] [address] [phone]         Interval History  History of Present Illness A:   Puerperal sepsis without acute organ dysfunction  2-week status post Vaginal Delivery G2P2 mastitis   P:   Routine care.  Continue oral antibiotic.  Discharge home today. Close follow-up in the office   S:   Patient is doing well.  Pain is well controlled.  Bleeding is normal.  Afebrile for 48hrs.    O:             Medications:  . cephALEXin   500 mg Oral Q6H SCH  . docusate sodium   100 mg Oral Daily  . enoxaparin  1 mg/kg (Dosing Weight) Subcutaneous Q12H    Physical Examination  Temp:  [98 F (36.7 C)-98.8 F (37.1 C)] 98 F (36.7 C) Heart Rate:  [63-74]  63 Resp:  [14-18] 18 BP: (115-144)/(73-83) 115/77 SpO2:  [95 %-98 %] 96 % O2 Device: None (Room air)     Intakes & Outputs (Last 24 hours) at 03/09/2024 0700 Last data filed at 03/08/2024 1800 24hr Volume @0700   Intake 640 mL  Output --  Net 640 mL    Physical Exam Vitals and nursing note reviewed.  Constitutional:  General: She is not in acute distress.    Appearance: Normal appearance. She is not ill-appearing.  HENT:     Head: Normocephalic and atraumatic.  Eyes:     Pupils: Pupils are equal, round, and reactive to light.  Cardiovascular:     Rate and Rhythm: Normal rate.  Musculoskeletal:        General: Normal range of motion.  Pulmonary:     Effort: Pulmonary effort is normal.  Abdominal:     Tenderness: There is no abdominal tenderness.  Neurological:     General: No focal deficit present.     Mental Status: She is alert and oriented to person, place, and time.  Psychiatric:        Mood and Affect: Mood normal.        Behavior: Behavior normal.        Thought Content: Thought content normal.        Judgment: Judgment normal.    Delivery: Date of Birth: 02/22/2025  Time of Birth: 6:31 AM   Information for the patient's newborn:  Maryruth, Apple [[id number]]  female Birth History  . Birth    Length: 20 (50.8 cm)    Weight: 3.3 kg (7 lb 4.4 oz)    HC 34.3 cm (13.5)  . Apgar    One: 8    Five: 9  . Discharge Weight: 3.32 kg (7 lb 5.1 oz)  . Delivery Method: Vaginal, Spontaneous  . Gestation Age: 32 3/7 wks  . Duration of Labor: 2nd: 71m  . Days in Hospital: 1.0  . Hospital Name: [facility]  . Hospital Location: [place], [place]    Results  Labs:  No results found for this or any previous visit (from the past 24 hours). Unresulted [employer] Current Status   Culture, Blood Blood Arm, Right Preliminary result   Culture, Blood Blood Hand, Right Preliminary result       Electronically  signed: Tinnie CHRISTELLA Levine, CNM 03/09/2024 / 8:35 AM   *Some images could not be shown.
